# Patient Record
Sex: Male | Born: 1990
Health system: Southern US, Community
[De-identification: ages and names within clinical notes are randomized; demographics above are authoritative.]

## PROBLEM LIST (undated history)

## (undated) DIAGNOSIS — K589 Irritable bowel syndrome without diarrhea: Secondary | ICD-10-CM

## (undated) DIAGNOSIS — E782 Mixed hyperlipidemia: Secondary | ICD-10-CM

## (undated) DIAGNOSIS — R05 Cough: Principal | ICD-10-CM

## (undated) DIAGNOSIS — Z0000 Encounter for general adult medical examination without abnormal findings: Secondary | ICD-10-CM

## (undated) DIAGNOSIS — I10 Essential (primary) hypertension: Secondary | ICD-10-CM

## (undated) DIAGNOSIS — R030 Elevated blood-pressure reading, without diagnosis of hypertension: Secondary | ICD-10-CM

## (undated) DIAGNOSIS — K449 Diaphragmatic hernia without obstruction or gangrene: Secondary | ICD-10-CM

## (undated) DIAGNOSIS — F909 Attention-deficit hyperactivity disorder, unspecified type: Secondary | ICD-10-CM

## (undated) DIAGNOSIS — K219 Gastro-esophageal reflux disease without esophagitis: Secondary | ICD-10-CM

## (undated) DIAGNOSIS — M545 Low back pain: Secondary | ICD-10-CM

## (undated) DIAGNOSIS — R1011 Right upper quadrant pain: Secondary | ICD-10-CM

## (undated) DIAGNOSIS — Q639 Congenital malformation of kidney, unspecified: Secondary | ICD-10-CM

## (undated) DIAGNOSIS — A084 Viral intestinal infection, unspecified: Secondary | ICD-10-CM

## (undated) DIAGNOSIS — F329 Major depressive disorder, single episode, unspecified: Secondary | ICD-10-CM

## (undated) DIAGNOSIS — R739 Hyperglycemia, unspecified: Secondary | ICD-10-CM

## (undated) DIAGNOSIS — L906 Striae atrophicae: Secondary | ICD-10-CM

## (undated) DIAGNOSIS — W57XXXA Bitten or stung by nonvenomous insect and other nonvenomous arthropods, initial encounter: Secondary | ICD-10-CM

## (undated) DIAGNOSIS — K760 Fatty (change of) liver, not elsewhere classified: Secondary | ICD-10-CM

## (undated) DIAGNOSIS — G4700 Insomnia, unspecified: Secondary | ICD-10-CM

## (undated) DIAGNOSIS — B354 Tinea corporis: Secondary | ICD-10-CM

## (undated) DIAGNOSIS — E663 Overweight: Secondary | ICD-10-CM

## (undated) DIAGNOSIS — R0602 Shortness of breath: Secondary | ICD-10-CM

## (undated) DIAGNOSIS — H6690 Otitis media, unspecified, unspecified ear: Secondary | ICD-10-CM

## (undated) DIAGNOSIS — L989 Disorder of the skin and subcutaneous tissue, unspecified: Secondary | ICD-10-CM

## (undated) DIAGNOSIS — R1013 Epigastric pain: Secondary | ICD-10-CM

## (undated) DIAGNOSIS — M25562 Pain in left knee: Principal | ICD-10-CM

## (undated) DIAGNOSIS — H6692 Otitis media, unspecified, left ear: Secondary | ICD-10-CM

## (undated) DIAGNOSIS — T7840XA Allergy, unspecified, initial encounter: Secondary | ICD-10-CM

## (undated) DIAGNOSIS — F32A Depression, unspecified: Secondary | ICD-10-CM

## (undated) HISTORY — DX: Attention-deficit hyperactivity disorder, unspecified type: F90.9

## (undated) HISTORY — PX: THYROGLOSSAL DUCT CYST: SHX297

## (undated) HISTORY — DX: Tinea corporis: B35.4

## (undated) HISTORY — DX: Major depressive disorder, single episode, unspecified: F32.9

## (undated) HISTORY — DX: Irritable bowel syndrome without diarrhea: K58.9

## (undated) HISTORY — DX: Hyperglycemia, unspecified: R73.9

## (undated) HISTORY — DX: Encounter for general adult medical examination without abnormal findings: Z00.00

## (undated) HISTORY — DX: Cough: R05

## (undated) HISTORY — DX: Diaphragmatic hernia without obstruction or gangrene: K44.9

## (undated) HISTORY — DX: Otitis media, unspecified, left ear: H66.92

## (undated) HISTORY — DX: Disorder of the skin and subcutaneous tissue, unspecified: L98.9

## (undated) HISTORY — DX: Epigastric pain: R10.13

## (undated) HISTORY — DX: Insomnia, unspecified: G47.00

## (undated) HISTORY — DX: Congenital malformation of kidney, unspecified: Q63.9

## (undated) HISTORY — DX: Fatty (change of) liver, not elsewhere classified: K76.0

## (undated) HISTORY — DX: Low back pain: M54.5

## (undated) HISTORY — DX: Elevated blood-pressure reading, without diagnosis of hypertension: R03.0

## (undated) HISTORY — DX: Essential (primary) hypertension: I10

## (undated) HISTORY — DX: Allergy, unspecified, initial encounter: T78.40XA

## (undated) HISTORY — DX: Right upper quadrant pain: R10.11

## (undated) HISTORY — PX: TYMPANOSTOMY TUBE PLACEMENT: SHX32

## (undated) HISTORY — DX: Shortness of breath: R06.02

## (undated) HISTORY — DX: Gastro-esophageal reflux disease without esophagitis: K21.9

## (undated) HISTORY — DX: Depression, unspecified: F32.A

## (undated) HISTORY — DX: Pain in left knee: M25.562

## (undated) HISTORY — DX: Striae atrophicae: L90.6

## (undated) HISTORY — DX: Mixed hyperlipidemia: E78.2

## (undated) HISTORY — DX: Viral intestinal infection, unspecified: A08.4

## (undated) HISTORY — DX: Overweight: E66.3

## (undated) HISTORY — DX: Bitten or stung by nonvenomous insect and other nonvenomous arthropods, initial encounter: W57.XXXA

## (undated) HISTORY — PX: CIRCUMCISION: SUR203

## (undated) HISTORY — DX: Otitis media, unspecified, unspecified ear: H66.90

## (undated) HISTORY — PX: TONSILLECTOMY AND ADENOIDECTOMY: SUR1326

---
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
## 2012-03-04 ENCOUNTER — Encounter: Payer: Self-pay | Admitting: Family Medicine

## 2012-03-04 ENCOUNTER — Ambulatory Visit (INDEPENDENT_AMBULATORY_CARE_PROVIDER_SITE_OTHER): Payer: Medicare HMO | Admitting: Family Medicine

## 2012-03-04 VITALS — BP 128/87 | HR 92 | Temp 98.4°F | Ht 73.25 in | Wt 277.0 lb

## 2012-03-04 DIAGNOSIS — R102 Pelvic and perineal pain: Secondary | ICD-10-CM

## 2012-03-04 DIAGNOSIS — N509 Disorder of male genital organs, unspecified: Secondary | ICD-10-CM

## 2012-03-04 NOTE — Progress Notes (Signed)
OFFICE NOTE  03/04/2012  CC:  Chief Complaint  Patient presents with  . Groin Pain    ? hernia; bulge and swelling in groin; pain at night     HPI: Patient is a 22 y.o. Caucasian male who is here for question of hernia. Noted onset of bulging 7 days ago diffusely across suprapubic area and this is associated with a feeling that his penis is getting retracted/pulled in.  Uncomfortable feeling.  Testicles don't feel or look any different.  Mild nagging pain in area of bulging on and off.  No meds taken.  Eating fine, passing gas and having BMs normal.  Urinating normal. He does lift heavy things at work frequently. The more he is questioned, the more it seems that the bulging is not that new but maybe more pronounced lately, plus his dad seems to have noted it after pt expressed discomfort with the "pulling" sensation in penis region, and encouraged him to come in today.   Pertinent PMH:  Past Medical History  Diagnosis Date  . Depression   . Overweight 07/07/2010  . Acute low back pain 07/07/2010  . 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 ADHD (attention deficit hyperactivity disorder) 07/07/2010  . 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 GERD 01/14/2009  . ELEVATED BLOOD PRESSURE 01/14/2009  . Insomnia 08/04/2010  . Allergic state 09/04/2010  . Viral gastroenteritis 10/17/2010  . Otitis media of left ear 12/09/2010  . SOB (shortness of breath) on exertion 03/06/2011  . Skin lesion of left leg 04/01/2011  . Cough 04/06/2011  . Knee pain, left 09/24/2011  . Otitis media 11/24/2011    MEDS:  Outpatient Prescriptions Prior to Visit  Medication Sig Dispense Refill  . cloNIDine (CATAPRES) 0.1 MG tablet Take 1 tablet (0.1 mg total) by mouth daily.  30 tablet  2  . lisdexamfetamine (VYVANSE) 50 MG capsule Take 1 capsule (50 mg total) by mouth every morning. Nov 2013 RX  30 capsule  0  . metoprolol succinate (TOPROL-XL) 25 MG 24 hr tablet Take 1 tablet (25 mg total) by mouth daily.  30 tablet   3  . naproxen sodium (ALEVE) 220 MG tablet Take 1 tablet (220 mg total) by mouth 2 (two) times daily with a meal. For a week and then 1 tab daily with food for the next week and call if no improvement  20 tablet  0  . pantoprazole (PROTONIX) 40 MG tablet Take 1 tablet (40 mg total) by mouth daily.  30 tablet  1  . ranitidine (ZANTAC) 300 MG tablet Take 1 tablet (300 mg total) by mouth 2 (two) times daily.  30 tablet  6  . [DISCONTINUED] amoxicillin-clavulanate (AUGMENTIN) 875-125 MG per tablet Take 1 tablet by mouth 2 (two) times daily.  20 tablet  0  . [DISCONTINUED] guaiFENesin (MUCINEX) 600 MG 12 hr tablet Take 1 tablet (600 mg total) by mouth 2 (two) times daily.  20 tablet  0  . [DISCONTINUED] triamcinolone cream (KENALOG) 0.5 % Apply two times daily to affected areas  15 g  1   Last reviewed on 03/04/2012  3:44 PM by Jeoffrey Massed, MD  PE: Blood pressure 128/87, pulse 92, temperature 98.4 F (36.9 C), temperature source Temporal, height 6' 1.25" (1.861 m), weight 277 lb (125.646 kg), SpO2 97.00%. Gen: Alert, well appearing, obese white male in NAD.  Patient is oriented to person, place, time, and situation. GU: symmetric suprapubic fat prominence, without focal palpable mass or abd wall defect.  Nontender.  He is uncircumcised but foreskin is easily retractable and normal in appearance.  Scrotum and testicles without mass, swelling, erythema, or tenderness.   IMPRESSION AND PLAN:  GU complaint: no sign of abnormality.  Reassured pt that I don't see any sign of hernia. Discussed the possibility of intermittent periods of foreskin adhesions to glans which may lead to the "pulling" sensation he is bothered by.  Aquafor to peri-coronal/glans region recommended prn. Call of worsening sx's or new sx's.  FOLLOW UP: prn

## 2012-03-04 NOTE — Patient Instructions (Addendum)
Buy over the counter ointment called Aquafor, apply to affected area every evening.

## 2012-04-11 ENCOUNTER — Telehealth: Payer: Self-pay | Admitting: Family Medicine

## 2012-04-11 DIAGNOSIS — F988 Other specified behavioral and emotional disorders with onset usually occurring in childhood and adolescence: Secondary | ICD-10-CM

## 2012-04-11 NOTE — Telephone Encounter (Signed)
Will hold this until Wed when we are at OR

## 2012-04-11 NOTE — Telephone Encounter (Signed)
Please advise Vyvanse refill? Last RX was wrote 10-21-11 quantity 30 with 0 refills for [facility] [employer].  Pt wants to pick up RX in OR so we could print Wed if ok to refill

## 2012-04-11 NOTE — Telephone Encounter (Signed)
OK to refill vyvanse same strength, same sig, #30

## 2012-04-11 NOTE — Telephone Encounter (Signed)
Patient is requesting a new prescription of vyvance and will pick up at the [facility] office.

## 2012-04-13 MED ORDER — LISDEXAMFETAMINE DIMESYLATE 50 MG PO CAPS: 50.0000 mg | ORAL_CAPSULE | ORAL | Status: DC

## 2012-04-13 NOTE — Telephone Encounter (Signed)
RX printed and put at front desk at OR. Pt informed

## 2012-07-19 ENCOUNTER — Telehealth: Payer: Self-pay

## 2012-07-19 DIAGNOSIS — F988 Other specified behavioral and emotional disorders with onset usually occurring in childhood and adolescence: Secondary | ICD-10-CM

## 2012-07-19 NOTE — Telephone Encounter (Signed)
OK to refill Vyvanse same strength, same sig, #30

## 2012-07-19 NOTE — Telephone Encounter (Signed)
Pts mom left a message stating pt needs a refill on his Vyvannse? Last RX was wrote on 04-13-12 quantity 30 with 0 refills  Please advise?

## 2012-07-20 MED ORDER — LISDEXAMFETAMINE DIMESYLATE 50 MG PO CAPS: 50.0000 mg | ORAL_CAPSULE | ORAL | Status: DC

## 2012-07-20 NOTE — Telephone Encounter (Signed)
RX printed and put in front cabinet. Mother informed

## 2012-10-14 ENCOUNTER — Encounter: Payer: Self-pay | Admitting: Family Medicine

## 2012-10-14 ENCOUNTER — Ambulatory Visit (INDEPENDENT_AMBULATORY_CARE_PROVIDER_SITE_OTHER): Payer: 59 | Admitting: Family Medicine

## 2012-10-14 ENCOUNTER — Ambulatory Visit: Payer: 59 | Admitting: Family Medicine

## 2012-10-14 VITALS — BP 130/80 | HR 89 | Temp 98.2°F | Ht 73.25 in | Wt 293.2 lb

## 2012-10-14 DIAGNOSIS — L0291 Cutaneous abscess, unspecified: Secondary | ICD-10-CM

## 2012-10-14 DIAGNOSIS — L0390 Cellulitis, unspecified: Secondary | ICD-10-CM

## 2012-10-14 DIAGNOSIS — W57XXXA Bitten or stung by nonvenomous insect and other nonvenomous arthropods, initial encounter: Secondary | ICD-10-CM

## 2012-10-14 DIAGNOSIS — F909 Attention-deficit hyperactivity disorder, unspecified type: Secondary | ICD-10-CM

## 2012-10-14 DIAGNOSIS — K219 Gastro-esophageal reflux disease without esophagitis: Secondary | ICD-10-CM

## 2012-10-14 DIAGNOSIS — L989 Disorder of the skin and subcutaneous tissue, unspecified: Secondary | ICD-10-CM

## 2012-10-14 DIAGNOSIS — R030 Elevated blood-pressure reading, without diagnosis of hypertension: Secondary | ICD-10-CM

## 2012-10-14 DIAGNOSIS — T148 Other injury of unspecified body region: Secondary | ICD-10-CM

## 2012-10-14 MED ORDER — DOXYCYCLINE HYCLATE 100 MG PO TABS: 100.0000 mg | ORAL_TABLET | Freq: Two times a day (BID) | ORAL | Status: DC

## 2012-10-14 MED ORDER — RANITIDINE HCL 300 MG PO TABS: 300.0000 mg | ORAL_TABLET | Freq: Every evening | ORAL | Status: DC | PRN

## 2012-10-14 NOTE — Patient Instructions (Addendum)
Start a probiotic daily such as Digestive Advantage, Align or a generic  Wood Tick Bite Ticks are insects that attach themselves to the skin. Most tick bites are harmless, but sometimes ticks carry diseases that can make a person quite ill. The chance of getting ill depends on:  The kind of tick that bites you.  Time of year.  How long the tick is attached.  Geographic location. Wood ticks are also called dog ticks. They are generally black. They can have white markings. They live in shrubs and grassy areas. They are larger than deer ticks. Wood ticks are about the size of a watermelon seed. They have a hard body. The most common places for ticks to attach themselves are the scalp, neck, armpits, waist, and groin. Wood ticks may stay attached for up to 2 weeks. TICKS MUST BE REMOVED AS SOON AS POSSIBLE TO HELP PREVENT DISEASES CAUSED BY TICK BITES.  TO REMOVE A TICK: 1. If available, put on latex gloves before trying to remove a tick. 2. Grasp the tick as close to the skin as possible, with curved forceps, fine tweezers or a special tick removal tool. 3. Pull gently with steady pressure until the tick lets go. Do not twist the tick or jerk it suddenly. This may break off the tick's head or mouth parts. 4. Do not crush the tick's body. This could force disease-carrying fluids from the tick into your body. 5. After the tick is removed, wash the bite area and your hands with soap and water or other disinfectant. 6. Apply a small amount of antiseptic cream or ointment to the bite site. 7. Wash and disinfect any instruments that were used. 8. Save the tick in a jar or plastic bag for later identification. Preserve the tick with a bit of alcohol or put it in the freezer. 9. Do not apply a hot match, petroleum jelly, or fingernail polish to the tick. This does not work and may increase the chances of disease from the tick bite. YOU MAY NEED TO SEE YOUR CAREGIVER FOR A TETANUS SHOT NOW IF:  You  have no idea when you had the last one.  You have never had a tetanus shot before. If you need a tetanus shot, and you decide not to get one, there is a rare chance of getting tetanus. Sickness from tetanus can be serious. If you get a tetanus shot, your arm may swell, get red and warm to the touch at the shot site. This is common and not a problem. PREVENTION  Wear protective clothing. Long sleeves and pants are best.  Wear white clothes to see ticks more easily  Tuck your pant legs into your socks.  If walking on trail, stay in the middle of the trail to avoid brushing against bushes.  Put insect repellent on all exposed skin and along boot tops, pant legs and sleeve cuffs  Check clothing, hair and skin repeatedly and before coming inside.  Brush off any ticks that are not attached. SEEK MEDICAL CARE IF:   You cannot remove a tick or part of the tick that is left in the skin.  Unexplained fever.  Redness and swelling in the area of the tick bite.  Tender, swollen lymph glands.  Diarrhea.  Weight loss.  Cough.  Fatigue.  Muscle, joint or bone pain.  Belly pain.  Headache.  Rash. SEEK IMMEDIATE MEDICAL CARE IF:   You develop an oral temperature above 102 F (38.9 C).  You are  having trouble walking or moving your legs.  Numbness in the legs.  Shortness of breath.  Confusion.  Repeated vomiting. Document Released: 01/31/2000 Document Revised: 04/27/2011 Document Reviewed: 01/09/2008 [facility] Patient Information 2014 [place], [place]. Deer Tick Bite Deer ticks are brown arachnids (spider family) that vary in size from as small as the head of a pin to 1/4 inch (1/2 cm) diameter. They thrive in wooded areas. Deer are the preferred host of adult deer ticks. Small rodents are the host of young ticks (nymphs). When a person walks in a field or wooded area, young and adult ticks in the surrounding grass and vegetation can attach themselves to the skin. They can  suck blood for hours to days if unnoticed. Ticks are found all over the [place] Some ticks carry a specific bacteria (Borrelia burgdorferi) that causes an infection called Lyme disease. The bacteria is typically passed into a person during the blood sucking process. This happens after the tick has been attached for at least a number of hours. While ticks can be found all over the [place] those carrying the bacteria that causes Lyme disease are most common in [place] and the [place]. Only a small proportion of ticks in these areas carry the Lyme disease bacteria and cause human infections. Ticks usually attach to warm spots on the body, such as the:  Head.  Back.  Neck.  Armpits.  Groin. SYMPTOMS  Most of the time, a deer tick bite will not be felt. You may or may not see the attached tick. You may notice mild irritation or redness around the bite site. If the deer tick passes the Lyme disease bacteria to a person, a round, red rash may be noticed 2 to 3 days after the bite. The rash may be clear in the middle, like a bull's-eye or target. If not treated, other symptoms may develop several days to weeks after the onset of the rash. These symptoms may include:  New rash lesions.  Fatigue and weakness.  General ill feeling and achiness.  Chills.  Headache and neck pain.  Swollen lymph glands.  Sore muscles and joints. 5 to 15% of untreated people with Lyme disease may develop more severe illnesses after several weeks to months. This may include inflammation of the brain lining (meningitis), nerve palsies, an abnormal heartbeat, or severe muscle and joint pain and inflammation (myositis or arthritis). DIAGNOSIS   Physical exam and medical history.  Viewing the tick if it was saved for confirmation.  Blood tests (to check or confirm the presence of Lyme disease). TREATMENT  Most ticks do not carry disease. If found, an attached tick should be removed using tweezers. Tweezers should  be placed under the body of the tick so it is removed by its attachment parts (pincers). If there are signs or symptoms of being sick, or Lyme disease is confirmed, medicines (antibiotics) that kill germs are usually prescribed. In more severe cases, antibiotics may be given through an intravenous (IV) access. HOME CARE INSTRUCTIONS   Always remove ticks with tweezers. Do not use petroleum jelly or other methods to kill or remove the tick. Slide the tweezers under the body and pull out as much as you can. If you are not sure what it is, save it in a jar and show your caregiver.  Once you remove the tick, the skin will heal on its own. Wash your hands and the affected area with water and soap. You may place a bandage on the affected area.  Take medicine as directed. You may be advised to take a full course of antibiotics.  Follow up with your caregiver as recommended. FINDING OUT THE RESULTS OF YOUR TEST Not all test results are available during your visit. If your test results are not back during the visit, make an appointment with your caregiver to find out the results. Do not assume everything is normal if you have not heard from your caregiver or the medical facility. It is important for you to follow up on all of your test results. PROGNOSIS  If Lyme disease is confirmed, early treatment with antibiotics is very effective. Following preventive guidelines is important since it is possible to get the disease more than once. PREVENTION   Wear long sleeves and long pants in wooded or grassy areas. Tuck your pants into your socks.  Use an insect repellent while hiking.  Check yourself, your children, and your pets regularly for ticks after playing outside.  Clear piles of leaves or brush from your yard. Ticks might live there. SEEK MEDICAL CARE IF:   You or your child has an oral temperature above 102 F (38.9 C).  You develop a severe headache following the bite.  You feel generally  ill.  You notice a rash.  You are having trouble removing the tick.  The bite area has red skin or yellow drainage. SEEK IMMEDIATE MEDICAL CARE IF:   Your face is weak and droopy or you have other neurological symptoms.  You have severe joint pain or weakness. MAKE SURE YOU:   Understand these instructions.  Will watch your condition.  Will get help right away if you are not doing well or get worse. FOR MORE INFORMATION Centers for Disease Control and Prevention: FootballExhibition.com.br American Academy [employer]: www.https://powers.com/ Document Released: 04/29/2009 Document Revised: 04/27/2011 Document Reviewed: 04/29/2009 [facility] Patient Information 2014 [place], [place].

## 2012-10-17 ENCOUNTER — Encounter: Payer: Self-pay | Admitting: Family Medicine

## 2012-10-17 DIAGNOSIS — W57XXXA Bitten or stung by nonvenomous insect and other nonvenomous arthropods, initial encounter: Secondary | ICD-10-CM

## 2012-10-17 HISTORY — DX: Bitten or stung by nonvenomous insect and other nonvenomous arthropods, initial encounter: W57.XXXA

## 2012-10-17 NOTE — Assessment & Plan Note (Signed)
Not taking anything but having frequent symptoms, avoid offending foods, start a probiotic. Restart ranitidine

## 2012-10-17 NOTE — Assessment & Plan Note (Signed)
Not taking medications at this time

## 2012-10-17 NOTE — Progress Notes (Signed)
Patient ID: Scott Pope, male   DOB: [birth year], 22 y.o.   MRN: [id number] Scott Pope [id number] January 21, 1991 10/17/2012      Progress Note-Follow Up  Subjective  Chief Complaint  Chief Complaint  Patient presents with  . Insect Bite    HPI Patient is a 22 year old male in today for evaluation of tick bite. He observed the tick bite about 2 weeks ago. He was there for several days. He's had persistent discomfort in the area as well some itching and drainage. Did initially have a low-grade fever and some fatigue but that is improving. No headaches or myalgias. No chest pain, palpitations, shortness of breath. Is having persistent heartburn but is not taking any medications.  Past Medical History  Diagnosis Date  . Depression   . Overweight(278.02) 07/07/2010  . Acute low back pain 07/07/2010  . 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 ADHD (attention deficit hyperactivity disorder) 07/07/2010  . 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 GERD 01/14/2009  . ELEVATED BLOOD PRESSURE 01/14/2009  . Insomnia 08/04/2010  . Allergic state 09/04/2010  . Viral gastroenteritis 10/17/2010  . Otitis media of left ear 12/09/2010  . SOB (shortness of breath) on exertion 03/06/2011  . Skin lesion of left leg 04/01/2011  . Cough 04/06/2011  . Knee pain, left 09/24/2011  . Otitis media 11/24/2011  . Tick bite 10/17/2012    Past Surgical History  Procedure Laterality Date  . Thyroglossal duct cyst      excised at 22 years of age  . Tympanostomy tube placement      multiple   . Circumcision  AT 22YRS OLD    HAD WILD REACTION TO SEDATION MED GIVEN    Family History  Problem Relation Age of Onset  . Obesity Mother   . Depression Mother   . Fibromyalgia Mother   . Hypertension Mother   . Chronic fatigue Mother   . Hypertension Father   . Fibromyalgia Maternal Grandmother   . Other Maternal Grandmother     reflux  . Other Maternal Grandfather     Lems disease  . Hypertension Paternal Grandfather   .  Arthritis Paternal Grandfather   . Colon cancer Neg Hx     History   Social History  . Marital Status: Single    Spouse Name: N/A    Number of Children: N/A  . Years of Education: N/A   Occupational History  . Not on file.   Social History Main Topics  . Smoking status: Never Smoker   . Smokeless tobacco: Never Used  . Alcohol Use: No  . Drug Use: No  . Sexual Activity: No   Other Topics Concern  . Not on file   Social History Narrative  . No narrative on file    No current outpatient prescriptions on file prior to visit.   No current facility-administered medications on file prior to visit.    Allergies  Allergen Reactions  . Strattera [Atomoxetine Hcl]     Suicidal thoughts, depression    Review of Systems  Review of Systems  Constitutional: Positive for malaise/fatigue. Negative for fever.  HENT: Negative for congestion.   Eyes: Negative for discharge.  Respiratory: Negative for shortness of breath.   Cardiovascular: Negative for chest pain, palpitations and leg swelling.  Gastrointestinal: Negative for nausea, abdominal pain and diarrhea.  Genitourinary: Negative for dysuria.  Musculoskeletal: Negative for falls.  Skin: Positive for itching and rash.  Neurological: Negative for loss of consciousness and headaches.  Endo/Heme/Allergies: Negative for polydipsia.  Psychiatric/Behavioral: Negative for depression and suicidal ideas. The patient is not nervous/anxious and does not have insomnia.     Objective  BP 130/80  Pulse 89  Temp(Src) 98.2 F (36.8 C) (Oral)  Ht 6' 1.25" (1.861 m)  Wt 293 lb 4 oz (133.017 kg)  BMI 38.41 kg/m2  SpO2 97%  Physical Exam  Physical Exam  Constitutional: He is oriented to person, place, and time and well-developed, well-nourished, and in no distress. No distress.  HENT:  Head: Normocephalic and atraumatic.  Eyes: Conjunctivae are normal.  Neck: Neck supple. No thyromegaly present.  Cardiovascular: Normal rate,  regular rhythm and normal heart sounds.   No murmur heard. Pulmonary/Chest: Effort normal and breath sounds normal. No respiratory distress.  Abdominal: He exhibits no distension and no mass. There is no tenderness.  Musculoskeletal: He exhibits no edema.  Neurological: He is alert and oriented to person, place, and time.  Skin: Skin is warm.  Erythematous maculopapular lesion behind knee, pus expelled.   Psychiatric: Memory, affect and judgment normal.      Assessment & Plan  GERD Not taking anything but having frequent symptoms, avoid offending foods, start a probiotic. Restart ranitidine  ELEVATED BLOOD PRESSURE Not on ADD meds no need for medications at this time.  ADHD (attention deficit hyperactivity disorder) Not taking medications at this time  Tick bite With slight cellulitis, started on Doxycycline, clean daily with hydrogen peroxide

## 2012-10-17 NOTE — Assessment & Plan Note (Signed)
Not on ADD meds no need for medications at this time.

## 2012-10-17 NOTE — Assessment & Plan Note (Signed)
With slight cellulitis, started on Doxycycline, clean daily with hydrogen peroxide

## 2012-11-22 ENCOUNTER — Ambulatory Visit (INDEPENDENT_AMBULATORY_CARE_PROVIDER_SITE_OTHER): Payer: 59 | Admitting: Family Medicine

## 2012-11-22 ENCOUNTER — Encounter: Payer: Self-pay | Admitting: Family Medicine

## 2012-11-22 VITALS — BP 118/82 | HR 62 | Temp 98.0°F | Ht 73.25 in | Wt 293.1 lb

## 2012-11-22 DIAGNOSIS — R030 Elevated blood-pressure reading, without diagnosis of hypertension: Secondary | ICD-10-CM

## 2012-11-22 DIAGNOSIS — B354 Tinea corporis: Secondary | ICD-10-CM

## 2012-11-22 DIAGNOSIS — Z0000 Encounter for general adult medical examination without abnormal findings: Secondary | ICD-10-CM

## 2012-11-22 DIAGNOSIS — Z23 Encounter for immunization: Secondary | ICD-10-CM

## 2012-11-22 HISTORY — DX: Encounter for general adult medical examination without abnormal findings: Z00.00

## 2012-11-22 HISTORY — DX: Tinea corporis: B35.4

## 2012-11-22 MED ORDER — CLOTRIMAZOLE-BETAMETHASONE 1-0.05 % EX CREA: TOPICAL_CREAM | Freq: Two times a day (BID) | CUTANEOUS | Status: DC | PRN

## 2012-11-22 NOTE — Assessment & Plan Note (Signed)
Well controlled, no changes 

## 2012-11-22 NOTE — Assessment & Plan Note (Signed)
Given Tdap and flu shot

## 2012-11-22 NOTE — Assessment & Plan Note (Signed)
Apply Lotrisone bid

## 2012-11-22 NOTE — Progress Notes (Signed)
Patient ID: Scott Pope, male   DOB: April 28, 1990, 22 y.o.   MRN: [id number] IMAAD REUSS [id number] March 03, 1990 11/22/2012      Progress Note-Follow Up  Subjective  Chief Complaint  Chief Complaint  Patient presents with  . knot on hand    X 1 month- right hand- itches and hurts if "poking on it", has got bigger  . Injections    flu and tdap    HPI  Patient is a 22 yo W male in today for evaluation of a lesion on his right wrist for about a month that is enlarging and pruritic. No pain, redness or injury. No radicular symptoms, no similar lesion. No recent illness, fevers, malaise, myalgias, cp, palp  Past Medical History  Diagnosis Date  . Depression   . Overweight(278.02) 07/07/2010  . Acute low back pain 07/07/2010  . 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 ADHD (attention deficit hyperactivity disorder) 07/07/2010  . 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 GERD 01/14/2009  . ELEVATED BLOOD PRESSURE 01/14/2009  . Insomnia 08/04/2010  . Allergic state 09/04/2010  . Viral gastroenteritis 10/17/2010  . Otitis media of left ear 12/09/2010  . SOB (shortness of breath) on exertion 03/06/2011  . Skin lesion of left leg 04/01/2011  . Cough 04/06/2011  . Knee pain, left 09/24/2011  . Otitis media 11/24/2011  . Tick bite 10/17/2012  . Tinea corporis 11/22/2012  . Preventative health care 11/22/2012    Past Surgical History  Procedure Laterality Date  . Thyroglossal duct cyst      excised at 22 years of age  . Tympanostomy tube placement      multiple   . Circumcision  AT 22YRS OLD    HAD WILD REACTION TO SEDATION MED GIVEN    Family History  Problem Relation Age of Onset  . Obesity Mother   . Depression Mother   . Fibromyalgia Mother   . Hypertension Mother   . Chronic fatigue Mother   . Hypertension Father   . Fibromyalgia Maternal Grandmother   . Other Maternal Grandmother     reflux  . Other Maternal Grandfather     Lems disease  . Hypertension Paternal Grandfather   .  Arthritis Paternal Grandfather   . Colon cancer Neg Hx     History   Social History  . Marital Status: Single    Spouse Name: N/A    Number of Children: N/A  . Years of Education: N/A   Occupational History  . Not on file.   Social History Main Topics  . Smoking status: Never Smoker   . Smokeless tobacco: Never Used  . Alcohol Use: No  . Drug Use: No  . Sexual Activity: No   Other Topics Concern  . Not on file   Social History Narrative  . No narrative on file    Current Outpatient Prescriptions on File Prior to Visit  Medication Sig Dispense Refill  . ranitidine (ZANTAC) 300 MG tablet Take 1 tablet (300 mg total) by mouth at bedtime as needed for heartburn.  30 tablet  2   No current facility-administered medications on file prior to visit.    Allergies  Allergen Reactions  . Strattera [Atomoxetine Hcl]     Suicidal thoughts, depression    Review of Systems  Review of Systems  Constitutional: Negative for fever and malaise/fatigue.  HENT: Negative for congestion.   Eyes: Negative for discharge.  Respiratory: Negative for shortness of breath.   Cardiovascular: Negative for chest  pain, palpitations and leg swelling.  Gastrointestinal: Negative for nausea, abdominal pain and diarrhea.  Genitourinary: Negative for dysuria.  Musculoskeletal: Negative for falls.  Skin: Positive for itching and rash.       Right hand  Neurological: Negative for loss of consciousness and headaches.  Endo/Heme/Allergies: Negative for polydipsia.  Psychiatric/Behavioral: Negative for depression and suicidal ideas. The patient is not nervous/anxious and does not have insomnia.     Objective  BP 118/82  Pulse 62  Temp(Src) 98 F (36.7 C) (Oral)  Ht 6' 1.25" (1.861 m)  Wt 293 lb 1.9 oz (132.958 kg)  BMI 38.39 kg/m2  SpO2 98%  Physical Exam  Physical Exam  Constitutional: He is oriented to person, place, and time and well-developed, well-nourished, and in no distress. No  distress.  HENT:  Head: Normocephalic and atraumatic.  Eyes: Conjunctivae are normal.  Neck: Neck supple. No thyromegaly present.  Cardiovascular: Normal rate, regular rhythm and normal heart sounds.   No murmur heard. Pulmonary/Chest: Effort normal and breath sounds normal. No respiratory distress.  Abdominal: He exhibits no distension and no mass. There is no tenderness.  Musculoskeletal: He exhibits no edema.  Neurological: He is alert and oriented to person, place, and time.  Skin: Skin is warm.  1 cm raised, erythematous lesion over right wrist  Psychiatric: Memory, affect and judgment normal.      Assessment & Plan  Tinea corporis Apply Lotrisone bid  ELEVATED BLOOD PRESSURE Well controlled, no changes  Preventative health care Given Tdap and flu shot

## 2012-11-22 NOTE — Patient Instructions (Addendum)
  Lotrimin/Clotrimazole is another option  Body Ringworm Ringworm (tinea corporis) is a fungal infection of the skin on the body. This infection is not caused by worms, but is actually caused by a fungus. Fungus normally lives on the top of your skin and can be useful. However, in the case of ringworms, the fungus grows out of control and causes a skin infection. It can involve any area of skin on the body and can spread easily from one person to another (contagious). Ringworm is a common problem for children, but it can affect adults as well. Ringworm is also often found in athletes, especially wrestlers who share equipment and mats.  CAUSES  Ringworm of the body is caused by a fungus called dermatophyte. It can spread by:  Touchingother people who are infected.  Touchinginfected pets.  Touching or sharingobjects that have been in contact with the infected person or pet (hats, combs, towels, clothing, sports equipment). SYMPTOMS   Itchy, raised red spots and bumps on the skin.  Ring-shaped rash.  Redness near the border of the rash with a clear center.  Dry and scaly skin on or around the rash. Not every person develops a ring-shaped rash. Some develop only the red, scaly patches. DIAGNOSIS  Most often, ringworm can be diagnosed by performing a skin exam. Your caregiver may choose to take a skin scraping from the affected area. The sample will be examined under the microscope to see if the fungus is present.  TREATMENT  Body ringworm may be treated with a topical antifungal cream or ointment. Sometimes, an antifungal shampoo that can be used on your body is prescribed. You may be prescribed antifungal medicines to take by mouth if your ringworm is severe, keeps coming back, or lasts a long time.  HOME CARE INSTRUCTIONS   Only take over-the-counter or prescription medicines as directed by your caregiver.  Wash the infected area and dry it completely before applying yourcream or  ointment.  When using antifungal shampoo to treat the ringworm, leave the shampoo on the body for 3 5 minutes before rinsing.   Wear loose clothing to stop clothes from rubbing and irritating the rash.  Wash or change your bed sheets every night while you have the rash.  Have your pet treated by your veterinarian if it has the same infection. To prevent ringworm:   Practice good hygiene.  Wear sandals or shoes in public places and showers.  Do not share personal items with others.  Avoid touching red patches of skin on other people.  Avoid touching pets that have bald spots or wash your hands after doing so. SEEK MEDICAL CARE IF:   Your rash continues to spread after 7 days of treatment.  Your rash is not gone in 4 weeks.  The area around your rash becomes red, warm, tender, and swollen. Document Released: 01/31/2000 Document Revised: 10/28/2011 Document Reviewed: 08/17/2011 [facility] Patient Information 2014 [place], [place].

## 2012-12-13 ENCOUNTER — Other Ambulatory Visit: Payer: Self-pay | Admitting: Family Medicine

## 2012-12-30 ENCOUNTER — Ambulatory Visit: Payer: 59 | Admitting: Family Medicine

## 2012-12-30 DIAGNOSIS — Z0289 Encounter for other administrative examinations: Secondary | ICD-10-CM

## 2013-01-23 ENCOUNTER — Encounter: Payer: Self-pay | Admitting: Physician Assistant

## 2013-01-23 ENCOUNTER — Ambulatory Visit: Payer: 59 | Admitting: Physician Assistant

## 2013-01-23 ENCOUNTER — Ambulatory Visit (INDEPENDENT_AMBULATORY_CARE_PROVIDER_SITE_OTHER): Payer: 59 | Admitting: Physician Assistant

## 2013-01-23 VITALS — BP 138/86 | HR 85 | Temp 98.7°F | Ht 73.25 in | Wt 296.0 lb

## 2013-01-23 DIAGNOSIS — R030 Elevated blood-pressure reading, without diagnosis of hypertension: Secondary | ICD-10-CM

## 2013-01-23 DIAGNOSIS — F909 Attention-deficit hyperactivity disorder, unspecified type: Secondary | ICD-10-CM

## 2013-01-23 DIAGNOSIS — F988 Other specified behavioral and emotional disorders with onset usually occurring in childhood and adolescence: Secondary | ICD-10-CM

## 2013-01-23 MED ORDER — LISDEXAMFETAMINE DIMESYLATE 50 MG PO CAPS: 50.0000 mg | ORAL_CAPSULE | ORAL | Status: DC

## 2013-01-23 MED ORDER — METOPROLOL SUCCINATE ER 25 MG PO TB24: 25.0000 mg | ORAL_TABLET | Freq: Every day | ORAL | Status: DC

## 2013-01-23 NOTE — Assessment & Plan Note (Signed)
Vyvanse restarted. Further refills to come from primary care provider.  Also restarted metoprolol given hx of elevated BPS while taking Vyvanse.  Followup in 2 weeks for BP recheck and to reassess symptoms.

## 2013-01-23 NOTE — Patient Instructions (Signed)
Restart medication as prescribed.  Return in 2 weeks for BP recheck.  Further refills will need to come from PCP Dr. Abner Greenspan.

## 2013-01-23 NOTE — Progress Notes (Signed)
Patient ID: Scott Pope, male   DOB: 1990-12-20, 22 y.o.   MRN: [id number]  Patient is a 22 year old Caucasian gentleman with history of ADHD who presents to clinic to discuss restarting Vyvanse.  Patient has been off medication since September. Wanted to try holiday from the medication to see if he can control his ADHD symptoms on his own. Patient states that his attempt has been unsuccessful.  Patient endorses hyperactivity and inattentiveness.  Symptoms are affecting his performance at work.  Patient did have history of elevated blood pressure while on Vyvanse, for which he took metoprolol 25 mg extended release tablet daily.  Patient denies chest pain, palpitations, vision changes, nausea or lightheadedness.  Patient denies change in weight.  Denies adverse effect on appetite while taking Vyvanse.   Past Medical History  Diagnosis Date  . Depression   . Overweight(278.02) 07/07/2010  . Acute low back pain 07/07/2010  . 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 ADHD (attention deficit hyperactivity disorder) 07/07/2010  . 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 GERD 01/14/2009  . ELEVATED BLOOD PRESSURE 01/14/2009  . Insomnia 08/04/2010  . Allergic state 09/04/2010  . Viral gastroenteritis 10/17/2010  . Otitis media of left ear 12/09/2010  . SOB (shortness of breath) on exertion 03/06/2011  . Skin lesion of left leg 04/01/2011  . Cough 04/06/2011  . Knee pain, left 09/24/2011  . Otitis media 11/24/2011  . Tick bite 10/17/2012  . Tinea corporis 11/22/2012  . Preventative health care 11/22/2012    Current Outpatient Prescriptions on File Prior to Visit  Medication Sig Dispense Refill  . clotrimazole-betamethasone (LOTRISONE) cream Apply topically 2 (two) times daily as needed. Rash, itchy  45 g  1  . pantoprazole (PROTONIX) 40 MG tablet TAKE 1 TABLET (40 MG TOTAL) BY MOUTH DAILY.  30 tablet  4  . ranitidine (ZANTAC) 300 MG tablet Take 1 tablet (300 mg total) by mouth at bedtime as needed for heartburn.   30 tablet  2   No current facility-administered medications on file prior to visit.    Allergies  Allergen Reactions  . Strattera [Atomoxetine Hcl]     Suicidal thoughts, depression    Family History  Problem Relation Age of Onset  . Obesity Mother   . Depression Mother   . Fibromyalgia Mother   . Hypertension Mother   . Chronic fatigue Mother   . Hypertension Father   . Fibromyalgia Maternal Grandmother   . Other Maternal Grandmother     reflux  . Other Maternal Grandfather     Lems disease  . Hypertension Paternal Grandfather   . Arthritis Paternal Grandfather   . Colon cancer Neg Hx     History   Social History  . Marital Status: Single    Spouse Name: N/A    Number of Children: N/A  . Years of Education: N/A   Social History Main Topics  . Smoking status: Never Smoker   . Smokeless tobacco: Never Used  . Alcohol Use: No  . Drug Use: No  . Sexual Activity: No   Other Topics Concern  . None   Social History Narrative  . None   Review of systems -- See HPI.  All other ROS are negative.  Filed Vitals:   01/23/13 1542  BP: 138/86  Pulse: 85  Temp: 98.7 F (37.1 C)   Physical Exam  Vitals reviewed. Constitutional: He is oriented to person, place, and time.  Overweight, well-developed in no acute distress.  HENT:  Head:  Normocephalic and atraumatic.  Right Ear: External ear normal.  Left Ear: External ear normal.  Nose: Nose normal.  Mouth/Throat: Oropharynx is clear and moist. No oropharyngeal exudate.  Eyes: Conjunctivae are normal. Pupils are equal, round, and reactive to light.  Cardiovascular: Normal rate, regular rhythm and normal heart sounds.   Pulmonary/Chest: Effort normal and breath sounds normal. No respiratory distress. He has no wheezes. He has no rales. He exhibits no tenderness.  Neurological: He is alert and oriented to person, place, and time. No cranial nerve deficit.  Skin: Skin is warm and dry. No rash noted.  Psychiatric:  Affect normal.   Assessment/Plan: ADHD (attention deficit hyperactivity disorder) Vyvanse restarted. Further refills to come from primary care provider.  Also restarted metoprolol given hx of elevated BPS while taking Vyvanse.  Followup in 2 weeks for BP recheck and to reassess symptoms.

## 2013-01-26 ENCOUNTER — Telehealth: Payer: Self-pay | Admitting: Family Medicine

## 2013-01-26 NOTE — Telephone Encounter (Signed)
Received form from insurance for prior auth, given to Tilda Franco PA

## 2013-01-26 NOTE — Telephone Encounter (Signed)
Received faxed from pharmacy that pt need prior auth for vyvanse 50mg  take 1 capsule by mouth in the morning, awaiting prior auth form to be faxed from insurance.

## 2013-02-01 NOTE — Telephone Encounter (Signed)
Insurance approved until 01/31/14, sent to nurse

## 2013-02-01 NOTE — Telephone Encounter (Signed)
PA Approval faxed to pharmacy/[facility]

## 2013-02-02 ENCOUNTER — Encounter: Payer: Self-pay | Admitting: Family Medicine

## 2013-02-02 ENCOUNTER — Ambulatory Visit (INDEPENDENT_AMBULATORY_CARE_PROVIDER_SITE_OTHER): Payer: 59 | Admitting: Family Medicine

## 2013-02-02 VITALS — BP 120/92 | HR 92 | Temp 97.6°F | Ht 73.25 in | Wt 295.0 lb

## 2013-02-02 DIAGNOSIS — R030 Elevated blood-pressure reading, without diagnosis of hypertension: Secondary | ICD-10-CM

## 2013-02-02 DIAGNOSIS — M545 Low back pain: Secondary | ICD-10-CM

## 2013-02-02 MED ORDER — METHYLPREDNISOLONE (PAK) 4 MG PO TABS: ORAL_TABLET | ORAL | Status: DC

## 2013-02-02 MED ORDER — CYCLOBENZAPRINE HCL 10 MG PO TABS: 10.0000 mg | ORAL_TABLET | Freq: Two times a day (BID) | ORAL | Status: DC | PRN

## 2013-02-02 NOTE — Progress Notes (Signed)
Pre visit review using our clinic review tool, if applicable. No additional management support is needed unless otherwise documented below in the visit note. 

## 2013-02-02 NOTE — Patient Instructions (Signed)
biofreeze or Salon Pas patches as needed  Lumbosacral Strain Lumbosacral strain is one of the most common causes of back pain. There are many causes of back pain. Most are not serious conditions. CAUSES  Your backbone (spinal column) is made up of 24 main vertebral bodies, the sacrum, and the coccyx. These are held together by muscles and tough, fibrous tissue (ligaments). Nerve roots pass through the openings between the vertebrae. A sudden move or injury to the back may cause injury to, or pressure on, these nerves. This may result in localized back pain or pain movement (radiation) into the buttocks, down the leg, and into the foot. Sharp, shooting pain from the buttock down the back of the leg (sciatica) is frequently associated with a ruptured (herniated) disk. Pain may be caused by muscle spasm alone. Your caregiver can often find the cause of your pain by the details of your symptoms and an exam. In some cases, you may need tests (such as X-rays). Your caregiver will work with you to decide if any tests are needed based on your specific exam. HOME CARE INSTRUCTIONS   Avoid an underactive lifestyle. Active exercise, as directed by your caregiver, is your greatest weapon against back pain.  Avoid hard physical activities (tennis, racquetball, waterskiing) if you are not in proper physical condition for it. This may aggravate or create problems.  If you have a back problem, avoid sports requiring sudden body movements. Swimming and walking are generally safer activities.  Maintain good posture.  Avoid becoming overweight (obese).  Use bed rest for only the most extreme, sudden (acute) episode. Your caregiver will help you determine how much bed rest is necessary.  For acute conditions, you may put ice on the injured area.  Put ice in a plastic bag.  Place a towel between your skin and the bag.  Leave the ice on for 15-20 minutes at a time, every 2 hours, or as needed.  After you are  improved and more active, it may help to apply heat for 30 minutes before activities. See your caregiver if you are having pain that lasts longer than expected. Your caregiver can advise appropriate exercises or therapy if needed. With conditioning, most back problems can be avoided. SEEK IMMEDIATE MEDICAL CARE IF:   You have numbness, tingling, weakness, or problems with the use of your arms or legs.  You experience severe back pain not relieved with medicines.  There is a change in bowel or bladder control.  You have increasing pain in any area of the body, including your belly (abdomen).  You notice shortness of breath, dizziness, or feel faint.  You feel sick to your stomach (nauseous), are throwing up (vomiting), or become sweaty.  You notice discoloration of your toes or legs, or your feet get very cold.  Your back pain is getting worse.  You have a fever. MAKE SURE YOU:   Understand these instructions.  Will watch your condition.  Will get help right away if you are not doing well or get worse. Document Released: 11/12/2004 Document Revised: 04/27/2011 Document Reviewed: 05/04/2008 [facility] Patient Information 2014 [place], [place].

## 2013-02-06 ENCOUNTER — Encounter: Payer: Self-pay | Admitting: Family Medicine

## 2013-02-06 DIAGNOSIS — M545 Low back pain: Secondary | ICD-10-CM | POA: Insufficient documentation

## 2013-02-06 NOTE — Progress Notes (Signed)
Patient ID: Scott Pope, male   DOB: 05-20-1990, 22 y.o.   MRN: [id number] Scott Pope [id number] [birth year] 02/06/2013      Progress Note-Follow Up  Subjective  Chief Complaint  Chief Complaint  Patient presents with  . Back Pain    lifted mom up this morning when she fell (mom is 340 pounds)    HPI  Patient is a 22 year old Caucasian male in today for evaluation of low back pain. His mother fell and he helped lift a 4 and has had pain since then. It is low back radiating to left hip but not down the left leg. No incontinence. Has not tried any over-the-counter medications yet.  Past Medical History  Diagnosis Date  . Depression   . Overweight(278.02) 07/07/2010  . Acute low back pain 07/07/2010  . 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 ADHD (attention deficit hyperactivity disorder) 07/07/2010  . 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 GERD 01/14/2009  . ELEVATED BLOOD PRESSURE 01/14/2009  . Insomnia 08/04/2010  . Allergic state 09/04/2010  . Viral gastroenteritis 10/17/2010  . Otitis media of left ear 12/09/2010  . SOB (shortness of breath) on exertion 03/06/2011  . Skin lesion of left leg 04/01/2011  . Cough 04/06/2011  . Knee pain, left 09/24/2011  . Otitis media 11/24/2011  . Tick bite 10/17/2012  . Tinea corporis 11/22/2012  . Preventative health care 11/22/2012    Past Surgical History  Procedure Laterality Date  . Thyroglossal duct cyst      excised at 22 years of age  . Tympanostomy tube placement      multiple   . Circumcision  AT 22YRS OLD    HAD WILD REACTION TO SEDATION MED GIVEN    Family History  Problem Relation Age of Onset  . Obesity Mother   . Depression Mother   . Fibromyalgia Mother   . Hypertension Mother   . Chronic fatigue Mother   . Hypertension Father   . Fibromyalgia Maternal Grandmother   . Other Maternal Grandmother     reflux  . Other Maternal Grandfather     Lems disease  . Hypertension Paternal Grandfather   . Arthritis Paternal  Grandfather   . Colon cancer Neg Hx     History   Social History  . Marital Status: Single    Spouse Name: N/A    Number of Children: N/A  . Years of Education: N/A   Occupational History  . Not on file.   Social History Main Topics  . Smoking status: Never Smoker   . Smokeless tobacco: Never Used  . Alcohol Use: No  . Drug Use: No  . Sexual Activity: No   Other Topics Concern  . Not on file   Social History Narrative  . No narrative on file    Current Outpatient Prescriptions on File Prior to Visit  Medication Sig Dispense Refill  . lisdexamfetamine (VYVANSE) 50 MG capsule Take 1 capsule (50 mg total) by mouth every morning. December 2014 RX  30 capsule  0  . metoprolol succinate (TOPROL-XL) 25 MG 24 hr tablet Take 1 tablet (25 mg total) by mouth daily.  90 tablet  3  . pantoprazole (PROTONIX) 40 MG tablet TAKE 1 TABLET (40 MG TOTAL) BY MOUTH DAILY.  30 tablet  4  . ranitidine (ZANTAC) 300 MG tablet Take 1 tablet (300 mg total) by mouth at bedtime as needed for heartburn.  30 tablet  2   No current facility-administered medications on file  prior to visit.    Allergies  Allergen Reactions  . Strattera [Atomoxetine Hcl]     Suicidal thoughts, depression    Review of Systems  Review of Systems  Constitutional: Negative for fever and malaise/fatigue.  HENT: Negative for congestion.   Eyes: Negative for discharge.  Respiratory: Negative for shortness of breath.   Cardiovascular: Negative for chest pain, palpitations and leg swelling.  Gastrointestinal: Negative for nausea, abdominal pain and diarrhea.  Genitourinary: Negative for dysuria.  Musculoskeletal: Positive for back pain. Negative for falls.       Low back pain  Skin: Negative for rash.  Neurological: Negative for loss of consciousness and headaches.  Endo/Heme/Allergies: Negative for polydipsia.  Psychiatric/Behavioral: Negative for depression and suicidal ideas. The patient is not nervous/anxious and  does not have insomnia.     Objective  BP 120/92  Pulse 92  Temp(Src) 97.6 F (36.4 C) (Oral)  Ht 6' 1.25" (1.861 m)  Wt 295 lb 0.6 oz (133.829 kg)  BMI 38.64 kg/m2  SpO2 94%  Physical Exam  Physical Exam  Constitutional: He is oriented to person, place, and time and well-developed, well-nourished, and in no distress. No distress.  HENT:  Head: Normocephalic and atraumatic.  Eyes: Conjunctivae are normal.  Neck: Neck supple. No thyromegaly present.  Cardiovascular: Normal rate, regular rhythm and normal heart sounds.   No murmur heard. Pulmonary/Chest: Effort normal and breath sounds normal. No respiratory distress.  Abdominal: He exhibits no distension and no mass. There is no tenderness.  Musculoskeletal: He exhibits no edema.  Neurological: He is alert and oriented to person, place, and time.  Skin: Skin is warm.  Psychiatric: Memory, affect and judgment normal.      Assessment & Plan  ELEVATED BLOOD PRESSURE Well controlled, no changes  Low back pain Encouraged no heavy lifting this week. Started on Medrol dosepak and Flexeril, report persistent symptoms

## 2013-02-06 NOTE — Assessment & Plan Note (Signed)
Encouraged no heavy lifting this week. Started on Medrol dosepak and Flexeril, report persistent symptoms

## 2013-02-06 NOTE — Assessment & Plan Note (Signed)
Well controlled, no changes 

## 2013-02-21 ENCOUNTER — Encounter: Payer: Self-pay | Admitting: Family Medicine

## 2013-02-21 ENCOUNTER — Ambulatory Visit (INDEPENDENT_AMBULATORY_CARE_PROVIDER_SITE_OTHER): Payer: 59 | Admitting: Family Medicine

## 2013-02-21 VITALS — BP 128/86 | HR 75 | Temp 98.2°F | Ht 73.25 in | Wt 293.0 lb

## 2013-02-21 DIAGNOSIS — F909 Attention-deficit hyperactivity disorder, unspecified type: Secondary | ICD-10-CM

## 2013-02-21 DIAGNOSIS — F988 Other specified behavioral and emotional disorders with onset usually occurring in childhood and adolescence: Secondary | ICD-10-CM

## 2013-02-21 DIAGNOSIS — R030 Elevated blood-pressure reading, without diagnosis of hypertension: Secondary | ICD-10-CM

## 2013-02-21 DIAGNOSIS — M545 Low back pain, unspecified: Secondary | ICD-10-CM

## 2013-02-21 DIAGNOSIS — K219 Gastro-esophageal reflux disease without esophagitis: Secondary | ICD-10-CM

## 2013-02-21 MED ORDER — LISDEXAMFETAMINE DIMESYLATE 50 MG PO CAPS: 50.0000 mg | ORAL_CAPSULE | Freq: Every day | ORAL | Status: DC

## 2013-02-21 MED ORDER — LISDEXAMFETAMINE DIMESYLATE 50 MG PO CAPS: 50.0000 mg | ORAL_CAPSULE | ORAL | Status: DC

## 2013-02-21 NOTE — Patient Instructions (Signed)
DASH Diet  The DASH diet stands for "Dietary Approaches to Stop Hypertension." It is a healthy eating plan that has been shown to reduce high blood pressure (hypertension) in as little as 14 days, while also possibly providing other significant health benefits. These other health benefits include reducing the risk of breast cancer after menopause and reducing the risk of type 2 diabetes, heart disease, colon cancer, and stroke. Health benefits also include weight loss and slowing kidney failure in patients with chronic kidney disease.   DIET GUIDELINES  · Limit salt (sodium). Your diet should contain less than 1500 mg of sodium daily.  · Limit refined or processed carbohydrates. Your diet should include mostly whole grains. Desserts and added sugars should be used sparingly.  · Include small amounts of heart-healthy fats. These types of fats include nuts, oils, and tub margarine. Limit saturated and trans fats. These fats have been shown to be harmful in the body.  CHOOSING FOODS   The following food groups are based on a 2000 calorie diet. See your Registered Dietitian for individual calorie needs.  Grains and Grain Products (6 to 8 servings daily)  · Eat More Often: Whole-wheat bread, brown rice, whole-grain or wheat pasta, quinoa, popcorn without added fat or salt (air popped).  · Eat Less Often: White bread, white pasta, white rice, cornbread.  Vegetables (4 to 5 servings daily)  · Eat More Often: Fresh, frozen, and canned vegetables. Vegetables may be raw, steamed, roasted, or grilled with a minimal amount of fat.  · Eat Less Often/Avoid: Creamed or fried vegetables. Vegetables in a cheese sauce.  Fruit (4 to 5 servings daily)  · Eat More Often: All fresh, canned (in natural juice), or frozen fruits. Dried fruits without added sugar. One hundred percent fruit juice (½ cup [237 mL] daily).  · Eat Less Often: Dried fruits with added sugar. Canned fruit in light or heavy syrup.  Lean Meats, Fish, and Poultry (2  servings or less daily. One serving is 3 to 4 oz [85-114 g]).  · Eat More Often: Ninety percent or leaner ground beef, tenderloin, sirloin. Round cuts of beef, chicken breast, turkey breast. All fish. Grill, bake, or broil your meat. Nothing should be fried.  · Eat Less Often/Avoid: Fatty cuts of meat, turkey, or chicken leg, thigh, or wing. Fried cuts of meat or fish.  Dairy (2 to 3 servings)  · Eat More Often: Low-fat or fat-free milk, low-fat plain or light yogurt, reduced-fat or part-skim cheese.  · Eat Less Often/Avoid: Milk (whole, 2%). Whole milk yogurt. Full-fat cheeses.  Nuts, Seeds, and Legumes (4 to 5 servings per week)  · Eat More Often: All without added salt.  · Eat Less Often/Avoid: Salted nuts and seeds, canned beans with added salt.  Fats and Sweets (limited)  · Eat More Often: Vegetable oils, tub margarines without trans fats, sugar-free gelatin. Mayonnaise and salad dressings.  · Eat Less Often/Avoid: Coconut oils, palm oils, butter, stick margarine, cream, half and half, cookies, candy, pie.  FOR MORE INFORMATION  The Dash Diet Eating Plan: www.dashdiet.org  Document Released: 01/22/2011 Document Revised: 04/27/2011 Document Reviewed: 01/22/2011  [employer]® Patient Information ©2014 [employer].

## 2013-02-21 NOTE — Progress Notes (Signed)
Pre visit review using our clinic review tool, if applicable. No additional management support is needed unless otherwise documented below in the visit note. 

## 2013-02-22 NOTE — Progress Notes (Signed)
Patient ID: Scott Pope, male   DOB: 1990-07-18, 23 y.o.   MRN: [id number] Scott Pope [id number] Dec 26, 1990 02/22/2013      Progress Note-Follow Up  Subjective  Chief Complaint  Chief Complaint  Patient presents with  . Follow-up    on Vyvanse    HPI  Patient is to-year-old Caucasian male who is in today in followup he is responding well to the increase in by aunt. He reports improved concentration inability to finish tasks. He denies any headaches, chest pain palpitations shortness or breath or GI complaints. Taking medications as prescribed. His low back pain is resolved with conservative management he is no longer needing medications.  Past Medical History  Diagnosis Date  . Depression   . Overweight(278.02) 07/07/2010  . Acute low back pain 07/07/2010  . 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 ADHD (attention deficit hyperactivity disorder) 07/07/2010  . 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 GERD 01/14/2009  . ELEVATED BLOOD PRESSURE 01/14/2009  . Insomnia 08/04/2010  . Allergic state 09/04/2010  . Viral gastroenteritis 10/17/2010  . Otitis media of left ear 12/09/2010  . SOB (shortness of breath) on exertion 03/06/2011  . Skin lesion of left leg 04/01/2011  . Cough 04/06/2011  . Knee pain, left 09/24/2011  . Otitis media 11/24/2011  . Tick bite 10/17/2012  . Tinea corporis 11/22/2012  . Preventative health care 11/22/2012    Past Surgical History  Procedure Laterality Date  . Thyroglossal duct cyst      excised at 23 years of age  . Tympanostomy tube placement      multiple   . Circumcision  AT 23YRS OLD    HAD WILD REACTION TO SEDATION MED GIVEN    Family History  Problem Relation Age of Onset  . Obesity Mother   . Depression Mother   . Fibromyalgia Mother   . Hypertension Mother   . Chronic fatigue Mother   . Hypertension Father   . Fibromyalgia Maternal Grandmother   . Other Maternal Grandmother     reflux  . Other Maternal Grandfather     Lems disease  .  Hypertension Paternal Grandfather   . Arthritis Paternal Grandfather   . Colon cancer Neg Hx     History   Social History  . Marital Status: Single    Spouse Name: N/A    Number of Children: N/A  . Years of Education: N/A   Occupational History  . Not on file.   Social History Main Topics  . Smoking status: Never Smoker   . Smokeless tobacco: Never Used  . Alcohol Use: No  . Drug Use: No  . Sexual Activity: No   Other Topics Concern  . Not on file   Social History Narrative  . No narrative on file    Current Outpatient Prescriptions on File Prior to Visit  Medication Sig Dispense Refill  . metoprolol succinate (TOPROL-XL) 25 MG 24 hr tablet Take 1 tablet (25 mg total) by mouth daily.  90 tablet  3  . pantoprazole (PROTONIX) 40 MG tablet TAKE 1 TABLET (40 MG TOTAL) BY MOUTH DAILY.  30 tablet  4  . ranitidine (ZANTAC) 300 MG tablet Take 1 tablet (300 mg total) by mouth at bedtime as needed for heartburn.  30 tablet  2   No current facility-administered medications on file prior to visit.    Allergies  Allergen Reactions  . Strattera [Atomoxetine Hcl]     Suicidal thoughts, depression    Review of  Systems  Review of Systems  Constitutional: Negative for fever and malaise/fatigue.  HENT: Negative for congestion.   Eyes: Negative for discharge.  Respiratory: Negative for shortness of breath.   Cardiovascular: Negative for chest pain, palpitations and leg swelling.  Gastrointestinal: Negative for nausea, abdominal pain and diarrhea.  Genitourinary: Negative for dysuria.  Musculoskeletal: Negative for falls.  Skin: Negative for rash.  Neurological: Negative for loss of consciousness and headaches.  Endo/Heme/Allergies: Negative for polydipsia.  Psychiatric/Behavioral: Negative for depression and suicidal ideas. The patient is not nervous/anxious and does not have insomnia.     Objective  BP 128/86  Pulse 75  Temp(Src) 98.2 F (36.8 C) (Oral)  Ht 6' 1.25"  (1.861 m)  Wt 293 lb (132.904 kg)  BMI 38.37 kg/m2  SpO2 98%  Physical Exam  Physical Exam  Constitutional: He is oriented to person, place, and time and well-developed, well-nourished, and in no distress. No distress.  HENT:  Head: Normocephalic and atraumatic.  Eyes: Conjunctivae are normal.  Neck: Neck supple. No thyromegaly present.  Cardiovascular: Normal rate, regular rhythm and normal heart sounds.   No murmur heard. Pulmonary/Chest: Effort normal and breath sounds normal. No respiratory distress.  Abdominal: He exhibits no distension and no mass. There is no tenderness.  Musculoskeletal: He exhibits no edema.  Neurological: He is alert and oriented to person, place, and time.  Skin: Skin is warm.  Psychiatric: Memory, affect and judgment normal.     Assessment & Plan  ADHD (attention deficit hyperactivity disorder) Good response to restarting Vyvanse, given refils today  Acute low back pain Resolved with conservative management.  GERD Improves with dietary restriction, tolerating Ranitidine.   ELEVATED BLOOD PRESSURE Improved with recheck no changes

## 2013-02-22 NOTE — Assessment & Plan Note (Signed)
Improved with recheck no changes

## 2013-02-22 NOTE — Assessment & Plan Note (Signed)
Resolved with conservative management.

## 2013-02-22 NOTE — Assessment & Plan Note (Signed)
Good response to restarting Vyvanse, given refils today

## 2013-02-22 NOTE — Assessment & Plan Note (Signed)
Improves with dietary restriction, tolerating Ranitidine.

## 2013-05-29 ENCOUNTER — Telehealth: Payer: Self-pay | Admitting: Family Medicine

## 2013-05-29 NOTE — Telephone Encounter (Signed)
Patient is requesting a new prescription of vyvance, next scheduled appointment is 06/20/13

## 2013-05-30 ENCOUNTER — Ambulatory Visit: Payer: 59 | Admitting: Family Medicine

## 2013-05-30 MED ORDER — LISDEXAMFETAMINE DIMESYLATE 50 MG PO CAPS
50.0000 mg | ORAL_CAPSULE | Freq: Every day | ORAL | Status: DC
Start: 2013-05-30 — End: 2013-07-14

## 2013-05-30 NOTE — Telephone Encounter (Signed)
1 RX for April printed and put on md's desk to sign  Pt informed and stated to give the RX to his mom today at her appt.

## 2013-06-20 ENCOUNTER — Ambulatory Visit: Payer: 59 | Admitting: Family Medicine

## 2013-06-20 DIAGNOSIS — Z0289 Encounter for other administrative examinations: Secondary | ICD-10-CM

## 2013-06-22 ENCOUNTER — Other Ambulatory Visit: Payer: Self-pay | Admitting: Internal Medicine

## 2013-07-14 ENCOUNTER — Encounter: Payer: Self-pay | Admitting: Physician Assistant

## 2013-07-14 ENCOUNTER — Ambulatory Visit (INDEPENDENT_AMBULATORY_CARE_PROVIDER_SITE_OTHER): Payer: 59 | Admitting: Physician Assistant

## 2013-07-14 VITALS — BP 128/82 | HR 81 | Temp 98.2°F | Resp 16 | Ht 73.25 in | Wt 294.8 lb

## 2013-07-14 DIAGNOSIS — F909 Attention-deficit hyperactivity disorder, unspecified type: Secondary | ICD-10-CM

## 2013-07-14 MED ORDER — LISDEXAMFETAMINE DIMESYLATE 50 MG PO CAPS: 50.0000 mg | ORAL_CAPSULE | Freq: Every day | ORAL | Status: DC

## 2013-07-14 NOTE — Progress Notes (Signed)
Pre visit review using our clinic review tool, if applicable. No additional management support is needed unless otherwise documented below in the visit note/SLS  

## 2013-07-14 NOTE — Assessment & Plan Note (Signed)
Doing well.  Continue current regimen.  Will give Rx for June, July and August.

## 2013-07-14 NOTE — Progress Notes (Signed)
Patient presents to clinic today for medication refill. Patient currently on Vyvanse 50 mg daily for ADHD symptoms.  Has been stable for some time on current dose.  Endorses good relief of symptoms. Take occasional drug holiday.  Denies chest pain, palpitations, anorexia or insomnia while on medication. Patient without acute concerns at today's visit.   Past Medical History  Diagnosis Date  . Depression   . Overweight 07/07/2010  . Acute low back pain 07/07/2010  . 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 ADHD (attention deficit hyperactivity disorder) 07/07/2010  . 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 GERD 01/14/2009  . ELEVATED BLOOD PRESSURE 01/14/2009  . Insomnia 08/04/2010  . Allergic state 09/04/2010  . Viral gastroenteritis 10/17/2010  . Otitis media of left ear 12/09/2010  . SOB (shortness of breath) on exertion 03/06/2011  . Skin lesion of left leg 04/01/2011  . Cough 04/06/2011  . Knee pain, left 09/24/2011  . Otitis media 11/24/2011  . Tick bite 10/17/2012  . Tinea corporis 11/22/2012  . Preventative health care 11/22/2012    Current Outpatient Prescriptions on File Prior to Visit  Medication Sig Dispense Refill  . metoprolol succinate (TOPROL-XL) 25 MG 24 hr tablet Take 1 tablet (25 mg total) by mouth daily.  90 tablet  3  . pantoprazole (PROTONIX) 40 MG tablet TAKE 1 TABLET (40 MG TOTAL) BY MOUTH DAILY.  30 tablet  4  . ranitidine (ZANTAC) 300 MG tablet Take 1 tablet (300 mg total) by mouth at bedtime as needed for heartburn.  30 tablet  2   No current facility-administered medications on file prior to visit.    Allergies  Allergen Reactions  . Strattera [Atomoxetine Hcl]     Suicidal thoughts, depression    Family History  Problem Relation Age of Onset  . Obesity Mother   . Depression Mother   . Fibromyalgia Mother   . Hypertension Mother   . Chronic fatigue Mother   . Hypertension Father   . Fibromyalgia Maternal Grandmother   . Other Maternal Grandmother     reflux   . Other Maternal Grandfather     Lems disease  . Hypertension Paternal Grandfather   . Arthritis Paternal Grandfather   . Colon cancer Neg Hx     History   Social History  . Marital Status: Single    Spouse Name: N/A    Number of Children: N/A  . Years of Education: N/A   Social History Main Topics  . Smoking status: Never Smoker   . Smokeless tobacco: Never Used  . Alcohol Use: No  . Drug Use: No  . Sexual Activity: No   Other Topics Concern  . None   Social History Narrative  . None   Review of Systems - See HPI.  All other ROS are negative.  BP 128/82  Pulse 81  Temp(Src) 98.2 F (36.8 C) (Oral)  Resp 16  Ht 6' 1.25" (1.861 m)  Wt 294 lb 12 oz (133.698 kg)  BMI 38.60 kg/m2  SpO2 96%  Physical Exam  Vitals reviewed. Constitutional: He is oriented to person, place, and time and well-developed, well-nourished, and in no distress.  HENT:  Head: Normocephalic and atraumatic.  Eyes: Conjunctivae are normal. Pupils are equal, round, and reactive to light.  Cardiovascular: Normal rate, regular rhythm, normal heart sounds and intact distal pulses.   Pulmonary/Chest: Effort normal and breath sounds normal. No respiratory distress.  Neurological: He is alert and oriented to person, place, and time.  Skin: Skin  is warm and dry. No rash noted.  Psychiatric: Affect normal.   Assessment/Plan: ADHD (attention deficit hyperactivity disorder) Doing well.  Continue current regimen.  Will give Rx for June, July and August.

## 2013-07-14 NOTE — Patient Instructions (Signed)
Please continue medication as directed.  Follow-up with Dr. Abner Greenspan for further refills.  It was a pleasure participating in your care today.    Attention Deficit Hyperactivity Disorder Attention deficit hyperactivity disorder (ADHD) is a problem with behavior issues based on the way the brain functions (neurobehavioral disorder). It is a common reason for behavior and academic problems in school. SYMPTOMS  There are 3 types of ADHD. The 3 types and some of the symptoms include:  Inattentive  Gets bored or distracted easily.  Loses or forgets things. Forgets to hand in homework.  Has trouble organizing or completing tasks.  Difficulty staying on task.  An inability to organize daily tasks and school work.  Leaving projects, chores, or homework unfinished.  Trouble paying attention or responding to details. Careless mistakes.  Difficulty following directions. Often seems like is not listening.  Dislikes activities that require sustained attention (like chores or homework).  Hyperactive-impulsive  Feels like it is impossible to sit still or stay in a seat. Fidgeting with hands and feet.  Trouble waiting turn.  Talking too much or out of turn. Interruptive.  Speaks or acts impulsively.  Aggressive, disruptive behavior.  Constantly busy or on the go, noisy.  Often leaves seat when they are expected to remain seated.  Often runs or climbs where it is not appropriate, or feels very restless.  Combined  Has symptoms of both of the above. Often children with ADHD feel discouraged about themselves and with school. They often perform well below their abilities in school. As children get older, the excess motor activities can calm down, but the problems with paying attention and staying organized persist. Most children do not outgrow ADHD but with good treatment can learn to cope with the symptoms. DIAGNOSIS  When ADHD is suspected, the diagnosis should be made by professionals  trained in ADHD. This professional will collect information about the individual suspected of having ADHD. Information must be collected from various settings where the person lives, works, or attends school.  Diagnosis will include:  Confirming symptoms began in childhood.  Ruling out other reasons for the child's behavior.  The health care providers will check with the child's school and check their medical records.  They will talk to teachers and parents.  Behavior rating scales for the child will be filled out by those dealing with the child on a daily basis. A diagnosis is made only after all information has been considered. TREATMENT  Treatment usually includes behavioral treatment, tutoring or extra support in school, and stimulant medicines. Because of the way a person's brain works with ADHD, these medicines decrease impulsivity and hyperactivity and increase attention. This is different than how they would work in a person who does not have ADHD. Other medicines used include antidepressants and certain blood pressure medicines. Most experts agree that treatment for ADHD should address all aspects of the person's functioning. Along with medicines, treatment should include structured classroom management at school. Parents should reward good behavior, provide constant discipline, and limit-setting. Tutoring should be available for the child as needed. ADHD is a life-long condition. If untreated, the disorder can have long-term serious effects into adolescence and adulthood. HOME CARE INSTRUCTIONS   Often with ADHD there is a lot of frustration among family members dealing with the condition. Blame and anger are also feelings that are common. In many cases, because the problem affects the family as a whole, the entire family may need help. A therapist can help the family find better  ways to handle the disruptive behaviors of the person with ADHD and promote change. If the person with ADHD  is young, most of the therapist's work is with the parents. Parents will learn techniques for coping with and improving their child's behavior. Sometimes only the child with the ADHD needs counseling. Your health care providers can help you make these decisions.  Children with ADHD may need help learning how to organize. Some helpful tips include:  Keep routines the same every day from wake-up time to bedtime. Schedule all activities, including homework and playtime. Keep the schedule in a place where the person with ADHD will often see it. Mark schedule changes as far in advance as possible.  Schedule outdoor and indoor recreation.  Have a place for everything and keep everything in its place. This includes clothing, backpacks, and school supplies.  Encourage writing down assignments and bringing home needed books. Work with your child's teachers for assistance in organizing school work.  Offer your child a well-balanced diet. Breakfast that includes a balance of whole grains, protein and, fruits or vegetables is especially important for school performance. Children should avoid drinks with caffeine including:  Soft drinks.  Coffee.  Tea.  However, some older children (adolescents) may find these drinks helpful in improving their attention. Because it can also be common for adolescents with ADHD to become addicted to caffeine, talk with your health care provider about what is a safe amount of caffeine intake for your child.  Children with ADHD need consistent rules that they can understand and follow. If rules are followed, give small rewards. Children with ADHD often receive, and expect, criticism. Look for good behavior and praise it. Set realistic goals. Give clear instructions. Look for activities that can foster success and self-esteem. Make time for pleasant activities with your child. Give lots of affection.  Parents are their children's greatest advocates. Learn as much as possible  about ADHD. This helps you become a stronger and better advocate for your child. It also helps you educate your child's teachers and instructors if they feel inadequate in these areas. Parent support groups are often helpful. A national group with local chapters is called Children and Adults with Attention Deficit Hyperactivity Disorder (CHADD). SEEK MEDICAL CARE IF:  Your child has repeated muscle twitches, cough or speech outbursts.  Your child has sleep problems.  Your child has a marked loss of appetite.  Your child develops depression.  Your child has new or worsening behavioral problems.  Your child develops dizziness.  Your child has a racing heart.  Your child has stomach pains.  Your child develops headaches. SEEK IMMEDIATE MEDICAL CARE IF:  Your child has been diagnosed with depression or anxiety and the symptoms seem to be getting worse.  Your child has been depressed and suddenly appears to have increased energy or motivation.  You are worried that your child is having a bad reaction to a medication he or she is taking for ADHD. Document Released: 01/23/2002 Document Revised: 11/23/2012 Document Reviewed: 10/10/2012 [facility] Patient Information 2014 [employer], [place].

## 2013-08-17 ENCOUNTER — Telehealth: Payer: Self-pay | Admitting: Family Medicine

## 2013-08-17 NOTE — Telephone Encounter (Signed)
Patient called in stating that he has a knot on his head and has been having headaches. Our office is full and I offered patient [facility]. Patient states that he has to work today and cannot come in. Transferred call to Wandalee FerdinandMary Catherine at [facility] to evaluate patient symptoms.

## 2013-08-17 NOTE — Telephone Encounter (Signed)
Patient Information:  Caller Name: Karleen HampshireSpencer  Phone: [phone]  Patient: Scott Pope, Scott Pope  Gender: Male  DOB: 11/01/1990  Age: [age] Years  PCP: Danise EdgeBlyth, [facility](Family Practice)  Office Follow Up:  Does the office need to follow up with this patient?: No  Instructions For The Office: N/A  RN Note:  Papular lesion is red without drainage. Was told it loks like a pimple without a white head yet. Instructed not to squeeze lesion.   Symptoms  Reason For Call & Symptoms: Emergent call:  Reports knot "like a zit" on posterior head for 2-3 days that causes head discomfort at times.  Reviewed Health History In EMR: Yes  Reviewed Medications In EMR: Yes  Reviewed Allergies In EMR: Yes  Reviewed Surgeries / Procedures: Yes  Date of Onset of Symptoms: 08/14/2013  Treatments Tried: Tylenol  Treatments Tried Worked: No  Guideline(s) Used:  Skin Lesion - Moles or Growths  Disposition Per Guideline:   See Today in Office  Reason For Disposition Reached:   Looks like a boil, infected sore, or deep ulcer  Advice Given:  Call Back If:  Fever or pain occurs  Any change in the mole or growth  You become worse.  Patient Refused Recommendation:  Patient Will Follow Up With Office Later  After initially requesting AM appointment for 08/17/13, and no appointments were available at [facility] or [place] offices, he declined next [facility] 1400 saying he would "just wait" until 08/24/13 appointment.

## 2013-08-17 NOTE — Telephone Encounter (Signed)
FYI

## 2013-08-24 ENCOUNTER — Ambulatory Visit (INDEPENDENT_AMBULATORY_CARE_PROVIDER_SITE_OTHER): Payer: 59 | Admitting: Family Medicine

## 2013-08-24 ENCOUNTER — Encounter: Payer: Self-pay | Admitting: Family Medicine

## 2013-08-24 VITALS — BP 122/86 | HR 80 | Temp 97.6°F | Ht 73.25 in | Wt 282.0 lb

## 2013-08-24 DIAGNOSIS — K589 Irritable bowel syndrome without diarrhea: Secondary | ICD-10-CM

## 2013-08-24 DIAGNOSIS — R030 Elevated blood-pressure reading, without diagnosis of hypertension: Secondary | ICD-10-CM

## 2013-08-24 DIAGNOSIS — K219 Gastro-esophageal reflux disease without esophagitis: Secondary | ICD-10-CM

## 2013-08-24 NOTE — Patient Instructions (Signed)
Benefiber powder twice daily and  A probiotic such as Digestive Advantage or [employer]'s Colon Health daily   Irritable Bowel Syndrome Irritable bowel syndrome (IBS) is caused by a disturbance of normal bowel function and is a common digestive disorder. You may also hear this condition called spastic colon, mucous colitis, and irritable colon. There is no cure for IBS. However, symptoms often gradually improve or disappear with a good diet, stress management, and medicine. This condition usually appears in late adolescence or early adulthood. Women develop it twice as often as men. CAUSES  After food has been digested and absorbed in the small intestine, waste material is moved into the large intestine, or colon. In the colon, water and salts are absorbed from the undigested products coming from the small intestine. The remaining residue, or fecal material, is held for elimination. Under normal circumstances, gentle, rhythmic contractions of the bowel walls push the fecal material along the colon toward the rectum. In IBS, however, these contractions are irregular and poorly coordinated. The fecal material is either retained too long, resulting in constipation, or expelled too soon, producing diarrhea. SIGNS AND SYMPTOMS  The most common symptom of IBS is abdominal pain. It is often in the lower left side of the abdomen, but it may occur anywhere in the abdomen. The pain comes from spasms of the bowel muscles happening too much and from the buildup of gas and fecal material in the colon. This pain:  Can range from sharp abdominal cramps to a dull, continuous ache.  Often worsens soon after eating.  Is often relieved by having a bowel movement or passing gas. Abdominal pain is usually accompanied by constipation, but it may also produce diarrhea. The diarrhea often occurs right after a meal or upon waking up in the morning. The stools are often soft, watery, and flecked with mucus. Other symptoms of IBS  include:  Bloating.  Loss of appetite.  Heartburn.  Backache.  Dull pain in the arms or shoulders.  Nausea.  Burping.  Vomiting.  Gas. IBS may also cause symptoms that are unrelated to the digestive system, such as:  Fatigue.  Headaches.  Anxiety.  Shortness of breath.  Trouble concentrating.  Dizziness. These symptoms tend to come and go. DIAGNOSIS  The symptoms of IBS may seem like symptoms of other, more serious digestive disorders. Your health care provider may want to perform tests to exclude these disorders.  TREATMENT Many medicines are available to help correct bowel function or relieve bowel spasms and abdominal pain. Among the medicines available are:  Laxatives for severe constipation and to help restore normal bowel habits.  Specific antidiarrheal medicines to treat severe or lasting diarrhea.  Antispasmodic agents to relieve intestinal cramps. Your health care provider may also decide to treat you with a mild tranquilizer or sedative during unusually stressful periods in your life. Your health care provider may also prescribe antidepressant medicine. The use of this medicine has been shown to reduce pain and other symptoms of IBS. Remember that if any medicine is prescribed for you, you should take it exactly as directed. Make sure your health care provider knows how well it worked for you. HOME CARE INSTRUCTIONS   Take all medicines as directed by your health care provider.  Avoid foods that are high in fat or oils, such as heavy cream, butter, frankfurters, sausage, and other fatty meats.  Avoid foods that make you go to the bathroom, such as fruit, fruit juice, and dairy products.  Cut out  carbonated drinks, chewing gum, and "gassy" foods such as beans and cabbage. This may help relieve bloating and burping.  Eat foods with bran, and drink plenty of liquids with the bran foods. This helps relieve constipation.  Keep track of what foods seem to  bring on your symptoms.  Avoid emotionally charged situations or circumstances that produce anxiety.  Start or continue exercising.  Get plenty of rest and sleep. Document Released: 02/02/2005 Document Revised: 02/07/2013 Document Reviewed: 09/23/2007 [facility] Patient Information 2015 [place], [place]. This information is not intended to replace advice given to you by your health care provider. Make sure you discuss any questions you have with your health care provider.

## 2013-08-25 ENCOUNTER — Encounter: Payer: Self-pay | Admitting: Family Medicine

## 2013-08-25 DIAGNOSIS — K589 Irritable bowel syndrome without diarrhea: Secondary | ICD-10-CM

## 2013-08-25 HISTORY — DX: Irritable bowel syndrome, unspecified: K58.9

## 2013-08-25 NOTE — Progress Notes (Signed)
Patient ID: Scott Pope Weisenberger, male   DOB: 10/16/1990, 23 y.o.   MRN: [id number] Scott Pope Imran [id number] 02/06/1991 08/25/2013      Progress Note-Follow Up  Subjective  Chief Complaint  Chief Complaint  Patient presents with  . Irritable Bowel Syndrome    HPI  Patient is a 23 year old male in today for routine medical care. In today complaining of irritable bowel type symptoms. He will go for several days with numerous loose stool followed by several days of no bowel movement. No bloody or tarry stool. Some nausea and occasional heartburn no vomiting. No fevers or chills. When he's not having diarrhea he has no abdominal pain. Frequent loose stools he has some cramping. No other acute complaints. No other recent illness. Denies CP/palp/SOB/HA/congestion/fevers GU c/o. Taking meds as prescribed  Past Medical History  Diagnosis Date  . Depression   . Overweight(278.02) 07/07/2010  . Acute low back pain 07/07/2010  . 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 ADHD (attention deficit hyperactivity disorder) 07/07/2010  . 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 GERD 01/14/2009  . ELEVATED BLOOD PRESSURE 01/14/2009  . Insomnia 08/04/2010  . Allergic state 09/04/2010  . Viral gastroenteritis 10/17/2010  . Otitis media of left ear 12/09/2010  . SOB (shortness of breath) on exertion 03/06/2011  . Skin lesion of left leg 04/01/2011  . Cough 04/06/2011  . Knee pain, left 09/24/2011  . Otitis media 11/24/2011  . Tick bite 10/17/2012  . Tinea corporis 11/22/2012  . Preventative health care 11/22/2012  . IBS (irritable bowel syndrome) 08/25/2013    Past Surgical History  Procedure Laterality Date  . Thyroglossal duct cyst      excised at 23 years of age  . Tympanostomy tube placement      multiple   . Circumcision  AT 23YRS OLD    HAD WILD REACTION TO SEDATION MED GIVEN    Family History  Problem Relation Age of Onset  . Obesity Mother   . Depression Mother   . Fibromyalgia Mother   . Hypertension  Mother   . Chronic fatigue Mother   . Hypertension Father   . Fibromyalgia Maternal Grandmother   . Other Maternal Grandmother     reflux  . Other Maternal Grandfather     Lems disease  . Hypertension Paternal Grandfather   . Arthritis Paternal Grandfather   . Colon cancer Neg Hx     History   Social History  . Marital Status: Single    Spouse Name: N/A    Number of Children: N/A  . Years of Education: N/A   Occupational History  . Not on file.   Social History Main Topics  . Smoking status: Never Smoker   . Smokeless tobacco: Never Used  . Alcohol Use: No  . Drug Use: No  . Sexual Activity: No   Other Topics Concern  . Not on file   Social History Narrative  . No narrative on file    Current Outpatient Prescriptions on File Prior to Visit  Medication Sig Dispense Refill  . lisdexamfetamine (VYVANSE) 50 MG capsule Take 1 capsule (50 mg total) by mouth daily. July-August Rx. Can be filled 09/12/13  30 capsule  0  . metoprolol succinate (TOPROL-XL) 25 MG 24 hr tablet Take 1 tablet (25 mg total) by mouth daily.  90 tablet  3  . pantoprazole (PROTONIX) 40 MG tablet TAKE 1 TABLET (40 MG TOTAL) BY MOUTH DAILY.  30 tablet  4  . ranitidine (  ZANTAC) 300 MG tablet Take 1 tablet (300 mg total) by mouth at bedtime as needed for heartburn.  30 tablet  2   No current facility-administered medications on file prior to visit.    Allergies  Allergen Reactions  . Strattera [Atomoxetine Hcl]     Suicidal thoughts, depression    Review of Systems  Review of Systems  Constitutional: Negative for fever and malaise/fatigue.  HENT: Negative for congestion.   Eyes: Negative for discharge.  Respiratory: Negative for shortness of breath.   Cardiovascular: Negative for chest pain, palpitations and leg swelling.  Gastrointestinal: Positive for heartburn, diarrhea and constipation. Negative for nausea and abdominal pain.  Genitourinary: Negative for dysuria.  Musculoskeletal:  Negative for falls.  Skin: Negative for rash.  Neurological: Negative for loss of consciousness and headaches.  Endo/Heme/Allergies: Negative for polydipsia.  Psychiatric/Behavioral: Negative for depression and suicidal ideas. The patient is not nervous/anxious and does not have insomnia.     Objective  BP 122/86  Pulse 80  Temp(Src) 97.6 F (36.4 C) (Oral)  Ht 6' 1.25" (1.861 m)  Wt 282 lb (127.914 kg)  BMI 36.93 kg/m2  SpO2 94%  Physical Exam  Physical Exam  Constitutional: He is oriented to person, place, and time and well-developed, well-nourished, and in no distress. No distress.  HENT:  Head: Normocephalic and atraumatic.  Eyes: Conjunctivae are normal.  Neck: Neck supple. No thyromegaly present.  Cardiovascular: Normal rate, regular rhythm and normal heart sounds.   No murmur heard. Pulmonary/Chest: Effort normal and breath sounds normal. No respiratory distress.  Abdominal: He exhibits no distension and no mass. There is no tenderness.  Musculoskeletal: He exhibits no edema.  Neurological: He is alert and oriented to person, place, and time.  Skin: Skin is warm.  Psychiatric: Memory, affect and judgment normal.      Assessment & Plan  GERD Avoid offending foods, start probiotics. Do not eat large meals in late evening and consider raising head of bed.   ELEVATED BLOOD PRESSURE Well controlled, no changes to meds. Encouraged heart healthy diet such as the DASH diet and exercise as tolerated.   IBS (irritable bowel syndrome) Avoid offending foods, start probiotics. Do not eat large meals in late evening and consider raising head of bed. Add Benefiber bid and let us know if not improved

## 2013-08-25 NOTE — Assessment & Plan Note (Signed)
Avoid offending foods, start probiotics. Do not eat large meals in late evening and consider raising head of bed. Add Benefiber bid and let us know if not improved

## 2013-08-25 NOTE — Assessment & Plan Note (Signed)
Avoid offending foods, start probiotics. Do not eat large meals in late evening and consider raising head of bed.  

## 2013-08-25 NOTE — Assessment & Plan Note (Signed)
Well controlled, no changes to meds. Encouraged heart healthy diet such as the DASH diet and exercise as tolerated.  °

## 2013-10-30 ENCOUNTER — Telehealth: Payer: Self-pay | Admitting: Family Medicine

## 2013-10-30 MED ORDER — LISDEXAMFETAMINE DIMESYLATE 50 MG PO CAPS: 50.0000 mg | ORAL_CAPSULE | Freq: Every day | ORAL | Status: DC

## 2013-10-30 NOTE — Telephone Encounter (Signed)
He can have a refill but then he will need an appt in next month to check bp on it.

## 2013-10-30 NOTE — Telephone Encounter (Signed)
Please advise parents mother that RX is done and needs an appt for next month

## 2013-10-30 NOTE — Telephone Encounter (Signed)
Caller name: Becky Berberian  Relation to pt: mother  Call back number: [phone] Pharmacy: [facility] [phone]   Reason for call:   pt requesting a refill of lisdexamfetamine (VYVANSE) 50 MG capsule

## 2013-10-30 NOTE — Telephone Encounter (Signed)
Please advise refill?  Last RX was done on 07-14-13 quantity 30 with 0 refills  Last OV was 08-24-13

## 2013-10-30 NOTE — Telephone Encounter (Signed)
Informed patient of this and he states that he will have to call back to schedule appointment

## 2013-11-09 ENCOUNTER — Emergency Department (HOSPITAL_BASED_OUTPATIENT_CLINIC_OR_DEPARTMENT_OTHER)
Admission: EM | Admit: 2013-11-09 | Discharge: 2013-11-10 | Disposition: A | Discharge status: Home / Self Care | Payer: 59 | Attending: Emergency Medicine | Admitting: Emergency Medicine

## 2013-11-09 ENCOUNTER — Ambulatory Visit (INDEPENDENT_AMBULATORY_CARE_PROVIDER_SITE_OTHER): Payer: 59 | Admitting: Family Medicine

## 2013-11-09 ENCOUNTER — Encounter (HOSPITAL_BASED_OUTPATIENT_CLINIC_OR_DEPARTMENT_OTHER): Payer: Self-pay | Admitting: Emergency Medicine

## 2013-11-09 ENCOUNTER — Encounter: Payer: Self-pay | Admitting: Family Medicine

## 2013-11-09 VITALS — BP 139/83 | HR 82 | Temp 98.3°F | Ht 73.25 in | Wt 278.4 lb

## 2013-11-09 DIAGNOSIS — F411 Generalized anxiety disorder: Secondary | ICD-10-CM

## 2013-11-09 DIAGNOSIS — F329 Major depressive disorder, single episode, unspecified: Secondary | ICD-10-CM | POA: Diagnosis not present

## 2013-11-09 DIAGNOSIS — Z8669 Personal history of other diseases of the nervous system and sense organs: Secondary | ICD-10-CM | POA: Diagnosis not present

## 2013-11-09 DIAGNOSIS — E663 Overweight: Secondary | ICD-10-CM | POA: Insufficient documentation

## 2013-11-09 DIAGNOSIS — K589 Irritable bowel syndrome without diarrhea: Secondary | ICD-10-CM | POA: Insufficient documentation

## 2013-11-09 DIAGNOSIS — Z8719 Personal history of other diseases of the digestive system: Secondary | ICD-10-CM | POA: Diagnosis not present

## 2013-11-09 DIAGNOSIS — K219 Gastro-esophageal reflux disease without esophagitis: Secondary | ICD-10-CM | POA: Insufficient documentation

## 2013-11-09 DIAGNOSIS — Q649 Congenital malformation of urinary system, unspecified: Secondary | ICD-10-CM | POA: Insufficient documentation

## 2013-11-09 DIAGNOSIS — R079 Chest pain, unspecified: Secondary | ICD-10-CM | POA: Insufficient documentation

## 2013-11-09 DIAGNOSIS — Z79899 Other long term (current) drug therapy: Secondary | ICD-10-CM | POA: Insufficient documentation

## 2013-11-09 DIAGNOSIS — R030 Elevated blood-pressure reading, without diagnosis of hypertension: Secondary | ICD-10-CM

## 2013-11-09 DIAGNOSIS — F3289 Other specified depressive episodes: Secondary | ICD-10-CM | POA: Insufficient documentation

## 2013-11-09 DIAGNOSIS — Z87828 Personal history of other (healed) physical injury and trauma: Secondary | ICD-10-CM | POA: Insufficient documentation

## 2013-11-09 DIAGNOSIS — R0789 Other chest pain: Secondary | ICD-10-CM | POA: Diagnosis not present

## 2013-11-09 DIAGNOSIS — Z8619 Personal history of other infectious and parasitic diseases: Secondary | ICD-10-CM | POA: Insufficient documentation

## 2013-11-09 DIAGNOSIS — F909 Attention-deficit hyperactivity disorder, unspecified type: Secondary | ICD-10-CM | POA: Insufficient documentation

## 2013-11-09 DIAGNOSIS — Z872 Personal history of diseases of the skin and subcutaneous tissue: Secondary | ICD-10-CM | POA: Insufficient documentation

## 2013-11-09 DIAGNOSIS — Z23 Encounter for immunization: Secondary | ICD-10-CM

## 2013-11-09 NOTE — Patient Instructions (Signed)
Stop caffeine and minimize sodium   Hypertension Hypertension, commonly called high blood pressure, is when the force of blood pumping through your arteries is too strong. Your arteries are the blood vessels that carry blood from your heart throughout your body. A blood pressure reading consists of a higher number over a lower number, such as 110/72. The higher number (systolic) is the pressure inside your arteries when your heart pumps. The lower number (diastolic) is the pressure inside your arteries when your heart relaxes. Ideally you want your blood pressure below 120/80. Hypertension forces your heart to work harder to pump blood. Your arteries may become narrow or stiff. Having hypertension puts you at risk for heart disease, stroke, and other problems.  RISK FACTORS Some risk factors for high blood pressure are controllable. Others are not.  Risk factors you cannot control include:   Race. You may be at higher risk if you are African American.  Age. Risk increases with age.  Gender. Men are at higher risk than women before age 32 years. After age 21, women are at higher risk than men. Risk factors you can control include:  Not getting enough exercise or physical activity.  Being overweight.  Getting too much fat, sugar, calories, or salt in your diet.  Drinking too much alcohol. SIGNS AND SYMPTOMS Hypertension does not usually cause signs or symptoms. Extremely high blood pressure (hypertensive crisis) may cause headache, anxiety, shortness of breath, and nosebleed. DIAGNOSIS  To check if you have hypertension, your health care provider will measure your blood pressure while you are seated, with your arm held at the level of your heart. It should be measured at least twice using the same arm. Certain conditions can cause a difference in blood pressure between your right and left arms. A blood pressure reading that is higher than normal on one occasion does not mean that you need  treatment. If one blood pressure reading is high, ask your health care provider about having it checked again. TREATMENT  Treating high blood pressure includes making lifestyle changes and possibly taking medicine. Living a healthy lifestyle can help lower high blood pressure. You may need to change some of your habits. Lifestyle changes may include:  Following the DASH diet. This diet is high in fruits, vegetables, and whole grains. It is low in salt, red meat, and added sugars.  Getting at least 2 hours of brisk physical activity every week.  Losing weight if necessary.  Not smoking.  Limiting alcoholic beverages.  Learning ways to reduce stress. If lifestyle changes are not enough to get your blood pressure under control, your health care provider may prescribe medicine. You may need to take more than one. Work closely with your health care provider to understand the risks and benefits. HOME CARE INSTRUCTIONS  Have your blood pressure rechecked as directed by your health care provider.   Take medicines only as directed by your health care provider. Follow the directions carefully. Blood pressure medicines must be taken as prescribed. The medicine does not work as well when you skip doses. Skipping doses also puts you at risk for problems.   Do not smoke.   Monitor your blood pressure at home as directed by your health care provider. SEEK MEDICAL CARE IF:   You think you are having a reaction to medicines taken.  You have recurrent headaches or feel dizzy.  You have swelling in your ankles.  You have trouble with your vision. SEEK IMMEDIATE MEDICAL CARE IF:  You develop a severe headache or confusion.  You have unusual weakness, numbness, or feel faint.  You have severe chest or abdominal pain.  You vomit repeatedly.  You have trouble breathing. MAKE SURE YOU:   Understand these instructions.  Will watch your condition.  Will get help right away if you are  not doing well or get worse. Document Released: 02/02/2005 Document Revised: 06/19/2013 Document Reviewed: 11/25/2012 [facility] Patient Information 2015 [place], [place]. This information is not intended to replace advice given to you by your health care provider. Make sure you discuss any questions you have with your health care provider.

## 2013-11-09 NOTE — Progress Notes (Signed)
Patient ID: Scott Pope, male   DOB: 1990/03/13, 23 y.o.   MRN: [id number] Scott Pope [id number] [birth year] 11/09/2013      Progress Note-Follow Up  Subjective  Chief Complaint  Chief Complaint  Patient presents with  . Hypertension    elevated bp X 3-4 days  . Injections    flu    HPI  Patient is a 23 year old male in today for routine medical care. Patient is in today to discuss episodes of chest pain. He describes it more of a discomfort. He said he did have to work but can also be happening when he sits at home waiting. He often feels lightheaded and short of breath at the same time. No palpitations. No recent illness. Symptoms do not awaken him from sleep. He acknowledges he's been drinking a great deal of red bull and mountain dew. Denies CP/palp/SOB/HA/congestion/fevers/GI or GU c/o. Taking meds as prescribed Past Medical History  Diagnosis Date  . Depression   . Overweight(278.02) 07/07/2010  . Acute low back pain 07/07/2010  . 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 ADHD (attention deficit hyperactivity disorder) 07/07/2010  . 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 GERD 01/14/2009  . ELEVATED BLOOD PRESSURE 01/14/2009  . Insomnia 08/04/2010  . Allergic state 09/04/2010  . Viral gastroenteritis 10/17/2010  . Otitis media of left ear 12/09/2010  . SOB (shortness of breath) on exertion 03/06/2011  . Skin lesion of left leg 04/01/2011  . Cough 04/06/2011  . Knee pain, left 09/24/2011  . Otitis media 11/24/2011  . Tick bite 10/17/2012  . Tinea corporis 11/22/2012  . Preventative health care 11/22/2012  . IBS (irritable bowel syndrome) 08/25/2013    Past Surgical History  Procedure Laterality Date  . Thyroglossal duct cyst      excised at 23 years of age  . Tympanostomy tube placement      multiple   . Circumcision  AT 23YRS OLD    HAD WILD REACTION TO SEDATION MED GIVEN    Family History  Problem Relation Age of Onset  . Obesity Mother   . Depression Mother   .  Fibromyalgia Mother   . Hypertension Mother   . Chronic fatigue Mother   . Hypertension Father   . Fibromyalgia Maternal Grandmother   . Other Maternal Grandmother     reflux  . Other Maternal Grandfather     Lems disease  . Hypertension Paternal Grandfather   . Arthritis Paternal Grandfather   . Colon cancer Neg Hx     History   Social History  . Marital Status: Single    Spouse Name: N/A    Number of Children: N/A  . Years of Education: N/A   Occupational History  . Not on file.   Social History Main Topics  . Smoking status: Never Smoker   . Smokeless tobacco: Never Used  . Alcohol Use: No  . Drug Use: No  . Sexual Activity: No   Other Topics Concern  . Not on file   Social History Narrative  . No narrative on file    Current Outpatient Prescriptions on File Prior to Visit  Medication Sig Dispense Refill  . lisdexamfetamine (VYVANSE) 50 MG capsule Take 1 capsule (50 mg total) by mouth daily. July-August Rx. Can be filled 09/12/13  30 capsule  0  . metoprolol succinate (TOPROL-XL) 25 MG 24 hr tablet Take 1 tablet (25 mg total) by mouth daily.  90 tablet  3  . ranitidine (ZANTAC) 300  MG tablet Take 1 tablet (300 mg total) by mouth at bedtime as needed for heartburn.  30 tablet  2   No current facility-administered medications on file prior to visit.    Allergies  Allergen Reactions  . Strattera [Atomoxetine Hcl]     Suicidal thoughts, depression    Review of Systems  Review of Systems  Constitutional: Negative for fever, chills and malaise/fatigue.  HENT: Negative for congestion, hearing loss and nosebleeds.   Eyes: Negative for discharge.  Respiratory: Negative for cough, sputum production, shortness of breath and wheezing.   Cardiovascular: Negative for chest pain, palpitations and leg swelling.  Gastrointestinal: Negative for heartburn, nausea, vomiting, abdominal pain, diarrhea, constipation and blood in stool.  Genitourinary: Negative for dysuria,  urgency, frequency and hematuria.  Musculoskeletal: Negative for back pain, falls and myalgias.  Skin: Negative for rash.  Neurological: Negative for dizziness, tremors, sensory change, focal weakness, loss of consciousness, weakness and headaches.  Endo/Heme/Allergies: Negative for polydipsia. Does not bruise/bleed easily.  Psychiatric/Behavioral: Negative for depression and suicidal ideas. The patient is not nervous/anxious and does not have insomnia.     Objective  BP 139/83  Pulse 82  Temp(Src) 98.3 F (36.8 C) (Oral)  Ht 6' 1.25" (1.861 m)  Wt 278 lb 6.4 oz (126.281 kg)  BMI 36.46 kg/m2  SpO2 99%  Physical Exam  Physical Exam  Constitutional: He is oriented to person, place, and time and well-developed, well-nourished, and in no distress. No distress.  HENT:  Head: Normocephalic and atraumatic.  Eyes: Conjunctivae are normal.  Neck: Neck supple. No thyromegaly present.  Cardiovascular: Normal rate, regular rhythm and normal heart sounds.   No murmur heard. Pulmonary/Chest: Effort normal and breath sounds normal. No respiratory distress.  Abdominal: He exhibits no distension and no mass. There is no tenderness.  Musculoskeletal: He exhibits no edema.  Neurological: He is alert and oriented to person, place, and time.  Skin: Skin is warm.  Psychiatric: Memory, affect and judgment normal.      Assessment & Plan   Overweight Encouraged DASH diet, decrease po intake and increase exercise as tolerated. Needs 7-8 hours of sleep nightly. Avoid trans fats, eat small, frequent meals every 4-5 hours with lean proteins, complex carbs and healthy fats. Minimize simple carbs, GMO foods.  ANXIETY STATE, UNSPECIFIED Discussed anxiety at length, declines meds for now  Atypical chest pain C/w anxiety encouraged to consider meds or counseling. ekg normal, minimize caffeine  GERD Avoid offending foods, start probiotics. Do not eat large meals in late evening and consider raising  head of bed.   ELEVATED BLOOD PRESSURE Mild, Well controlled, no changes to meds. Encouraged heart healthy diet such as the DASH diet and exercise as tolerated.

## 2013-11-09 NOTE — Progress Notes (Signed)
Pre visit review using our clinic review tool, if applicable. No additional management support is needed unless otherwise documented below in the visit note. 

## 2013-11-09 NOTE — ED Notes (Addendum)
Chest pain off and on for over a week. The past couple of days he has felt like someone is sitting on his chest. He has a cold and has been using Mucinex DM. He was seen by Dr Rogelia Rohrer today and had a normal exam.

## 2013-11-09 NOTE — ED Provider Notes (Signed)
CSN: [id number]     Arrival date & time 11/09/13  2203 History   First MD Initiated Contact with Patient 11/09/13 2355     This chart was scribed for Hanley Seamen, MD by Arlan Organ, ED Scribe. This patient was seen in room MH01/MH01 and the patient's care was started 11:58 PM.   Chief Complaint  Patient presents with  . Chest Pain   HPI  HPI Comments: Scott Pope is a 23 y.o. male with a PMHx of ADHD, and GERD  who presents to the Emergency Department complaining of constant, moderate chest pressure x 1 week that is unchanged. Pt characterizes discomfort as "it feels like someone is sitting on my chest". Discomfort is not affected by day, time or activity. Scott Pope also reports intermittent, sharp, substernal chest pain onset 2 days lasting for approximately 1 minute when episodes come. Pain is exacerbated with deep breathing. He denies any cough, fever, nausea, vomiting, or leg swelling. No recent long distance travel. Pt is currently on Protonix for acid reflux daily.  Past Medical History  Diagnosis Date  . Depression   . Overweight(278.02) 07/07/2010  . Acute low back pain 07/07/2010  . 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 ADHD (attention deficit hyperactivity disorder) 07/07/2010  . 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 GERD 01/14/2009  . ELEVATED BLOOD PRESSURE 01/14/2009  . Insomnia 08/04/2010  . Allergic state 09/04/2010  . Viral gastroenteritis 10/17/2010  . Otitis media of left ear 12/09/2010  . SOB (shortness of breath) on exertion 03/06/2011  . Skin lesion of left leg 04/01/2011  . Cough 04/06/2011  . Knee pain, left 09/24/2011  . Otitis media 11/24/2011  . Tick bite 10/17/2012  . Tinea corporis 11/22/2012  . Preventative health care 11/22/2012  . IBS (irritable bowel syndrome) 08/25/2013   Past Surgical History  Procedure Laterality Date  . Thyroglossal duct cyst      excised at 23 years of age  . Tympanostomy tube placement      multiple   . Circumcision  AT 24YRS OLD     HAD WILD REACTION TO SEDATION MED GIVEN   Family History  Problem Relation Age of Onset  . Obesity Mother   . Depression Mother   . Fibromyalgia Mother   . Hypertension Mother   . Chronic fatigue Mother   . Hypertension Father   . Fibromyalgia Maternal Grandmother   . Other Maternal Grandmother     reflux  . Other Maternal Grandfather     Lems disease  . Hypertension Paternal Grandfather   . Arthritis Paternal Grandfather   . Colon cancer Neg Hx    History  Substance Use Topics  . Smoking status: Never Smoker   . Smokeless tobacco: Never Used  . Alcohol Use: No    Review of Systems  All other systems reviewed and are negative.   Allergies  Strattera  Home Medications   Prior to Admission medications   Medication Sig Start Date End Date Taking? Authorizing Provider  lisdexamfetamine (VYVANSE) 50 MG capsule Take 1 capsule (50 mg total) by mouth daily. July-August Rx. Can be filled 09/12/13 10/30/13   Bradd Canary, MD  metoprolol succinate (TOPROL-XL) 25 MG 24 hr tablet Take 1 tablet (25 mg total) by mouth daily. 01/23/13   Waldon Merl, PA-C  ranitidine (ZANTAC) 300 MG tablet Take 1 tablet (300 mg total) by mouth at bedtime as needed for heartburn. 10/14/12   Bradd Canary, MD   Triage Vitals:  BP 145/87  Pulse 65  Temp(Src) 98.5 F (36.9 C) (Oral)  Resp 20  Ht  (1.905 m)  Wt 278 lb (126.1 kg)  BMI 34.75 kg/m2  SpO2 99%   Physical Exam  General: Well-developed, well-nourished male in no acute distress; appearance consistent with age of record HENT: normocephalic; atraumatic Eyes: pupils equal, round and reactive to light; extraocular muscles intact Neck: supple Heart: regular rate and rhythm; no murmurs, rubs or gallops Lungs: clear to auscultation bilaterally Abdomen: soft; nondistended; nontender; no masses or hepatosplenomegaly; bowel sounds present Extremities: No deformity; full range of motion; pulses normal Neurologic: Awake, alert and  oriented; motor function intact in all extremities and symmetric; no facial droop Skin: Warm and dry Psychiatric: Normal mood and affect   ED Course  Procedures (including critical care time)  DIAGNOSTIC STUDIES: Oxygen Saturation is 99% on RA, Normal by my interpretation.    COORDINATION OF CARE:   EKG Interpretation   Date/Time:  Thursday November 09 2013 22:10:58 EDT Ventricular Rate:  68 PR Interval:  148 QRS Duration: 84 QT Interval:  378 QTC Calculation: 401 R Axis:   73 Text Interpretation:  Normal sinus rhythm Normal ECG No old tracing to  compare Confirmed by Ethelda Chick  MD, SAM [phone]) on 11/09/2013 10:16:29 PM      MDM  Nursing notes and vitals signs, including pulse oximetry, reviewed.  Summary of this visit's results, reviewed by myself:  Labs:  No results found for this or any previous visit (from the past 24 hour(s)).  Imaging Studies: Dg Chest 2 View  11/10/2013   CLINICAL DATA:  Shortness of breath, mid chest pain radiating to right lower flank for 1 week, smoking history  EXAM: CHEST  2 VIEW  COMPARISON:  None.  FINDINGS: The heart size and mediastinal contours are within normal limits. Both lungs are clear. The visualized skeletal structures are unremarkable.  IMPRESSION: No active cardiopulmonary disease.   Electronically Signed   By: Esperanza Heir M.D.   On: 11/10/2013 00:50   1:08 AM Patient has improved air movement after albuterol and Atrovent treatment. He states his chest feels somewhat tighter but the pain has abated.  I personally performed the services described in this documentation, which was scribed in my presence. The recorded information has been reviewed and is accurate.    Hanley Seamen, MD 11/10/13 [phone]

## 2013-11-10 ENCOUNTER — Ambulatory Visit: Payer: 59 | Admitting: Physician Assistant

## 2013-11-10 ENCOUNTER — Emergency Department (HOSPITAL_BASED_OUTPATIENT_CLINIC_OR_DEPARTMENT_OTHER): Payer: 59

## 2013-11-10 ENCOUNTER — Encounter: Payer: Self-pay | Admitting: Physician Assistant

## 2013-11-10 ENCOUNTER — Ambulatory Visit (INDEPENDENT_AMBULATORY_CARE_PROVIDER_SITE_OTHER): Payer: 59 | Admitting: Physician Assistant

## 2013-11-10 VITALS — BP 130/78 | HR 84 | Temp 98.2°F | Resp 16 | Ht 73.25 in | Wt 278.4 lb

## 2013-11-10 DIAGNOSIS — R0789 Other chest pain: Secondary | ICD-10-CM

## 2013-11-10 MED ORDER — ALBUTEROL SULFATE HFA 108 (90 BASE) MCG/ACT IN AERS
2.0000 | INHALATION_SPRAY | RESPIRATORY_TRACT | Status: DC | PRN
Start: 2013-11-10 — End: 2013-11-10
  Administered 2013-11-10: 2 via RESPIRATORY_TRACT

## 2013-11-10 MED ORDER — ALBUTEROL SULFATE HFA 108 (90 BASE) MCG/ACT IN AERS: 2.0000 | INHALATION_SPRAY | RESPIRATORY_TRACT | Status: DC

## 2013-11-10 MED ORDER — ALBUTEROL SULFATE HFA 108 (90 BASE) MCG/ACT IN AERS
INHALATION_SPRAY | RESPIRATORY_TRACT | Status: AC
  Administered 2013-11-10: 2 via RESPIRATORY_TRACT
  Filled 2013-11-10: qty 6.7

## 2013-11-10 MED ORDER — IPRATROPIUM-ALBUTEROL 0.5-2.5 (3) MG/3ML IN SOLN
3.0000 mL | RESPIRATORY_TRACT | Status: DC
  Administered 2013-11-10: 3 mL via RESPIRATORY_TRACT
  Filled 2013-11-10: qty 3

## 2013-11-10 NOTE — Patient Instructions (Addendum)
Please continue medications as prescribed. I think there is the potential of some mild allergy-induced asthma (airway reactivity).  Keep using the inhaler as directed.  I also think mild anxiety could be the cause.  Please reconsider treatment for this. Follow-up with Dr. Abner Greenspan as directed.  If symptoms recur and are severe, please return to the ER.

## 2013-11-10 NOTE — Progress Notes (Signed)
Pre visit review using our clinic review tool, if applicable. No additional management support is needed unless otherwise documented below in the visit note/SLS  

## 2013-11-10 NOTE — Discharge Instructions (Signed)

## 2013-11-10 NOTE — Patient Instructions (Signed)
Instructed patient on the proper use of administering albuterol mdi via aerochamber

## 2013-11-12 ENCOUNTER — Encounter: Payer: Self-pay | Admitting: Family Medicine

## 2013-11-12 DIAGNOSIS — R0789 Other chest pain: Secondary | ICD-10-CM | POA: Insufficient documentation

## 2013-11-12 NOTE — Assessment & Plan Note (Signed)
ER workup unremarkable.  Patient with history of GERD that is well-controlled.  Also with history of exercise-induced asthma and anxiety which could both be causative factors.  Highly doubt cardiac etiology.  Giving improvement in symptoms with nebulizer, suspect chest tightness from allergic asthma, although anxiety would certainly worsen symptoms.Encouraged patient to use Albuterol inhaler as directed. Also encouraged him to reconsider anxiety as potential cause. Follow-up with PCP in 3-4 weeks.  Returnn sooner if needed.  Alarm signs/symptoms discussed with patient.

## 2013-11-12 NOTE — Assessment & Plan Note (Signed)
Avoid offending foods, start probiotics. Do not eat large meals in late evening and consider raising head of bed.  

## 2013-11-12 NOTE — Progress Notes (Signed)
Patient presents to clinic today for ER follow-up of atypical chest pain.  Patient was seen in ER earlier this morning with complaints of sudden onset sharp chest pain and chest tightness.  Proceeded to ER where EKG and CXR were performed, both within normal limits.  Patient was given a in-house nebulizer treatment due to chest tightness and noted resolution of symptoms.    Past Medical History  Diagnosis Date  . Depression   . Overweight(278.02) 07/07/2010  . Acute low back pain 07/07/2010  . 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 ADHD (attention deficit hyperactivity disorder) 07/07/2010  . 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 GERD 01/14/2009  . ELEVATED BLOOD PRESSURE 01/14/2009  . Insomnia 08/04/2010  . Allergic state 09/04/2010  . Viral gastroenteritis 10/17/2010  . Otitis media of left ear 12/09/2010  . SOB (shortness of breath) on exertion 03/06/2011  . Skin lesion of left leg 04/01/2011  . Cough 04/06/2011  . Knee pain, left 09/24/2011  . Otitis media 11/24/2011  . Tick bite 10/17/2012  . Tinea corporis 11/22/2012  . Preventative health care 11/22/2012  . IBS (irritable bowel syndrome) 08/25/2013    Current Outpatient Prescriptions on File Prior to Visit  Medication Sig Dispense Refill  . lisdexamfetamine (VYVANSE) 50 MG capsule Take 1 capsule (50 mg total) by mouth daily. July-August Rx. Can be filled 09/12/13  30 capsule  0  . metoprolol succinate (TOPROL-XL) 25 MG 24 hr tablet Take 1 tablet (25 mg total) by mouth daily.  90 tablet  3  . ranitidine (ZANTAC) 300 MG tablet Take 1 tablet (300 mg total) by mouth at bedtime as needed for heartburn.  30 tablet  2   No current facility-administered medications on file prior to visit.    Allergies  Allergen Reactions  . Strattera [Atomoxetine Hcl]     Suicidal thoughts, depression    Family History  Problem Relation Age of Onset  . Obesity Mother   . Depression Mother   . Fibromyalgia Mother   . Hypertension Mother   . Chronic  fatigue Mother   . Hypertension Father   . Fibromyalgia Maternal Grandmother   . Other Maternal Grandmother     reflux  . Other Maternal Grandfather     Lems disease  . Hypertension Paternal Grandfather   . Arthritis Paternal Grandfather   . Colon cancer Neg Hx     History   Social History  . Marital Status: Single    Spouse Name: N/A    Number of Children: N/A  . Years of Education: N/A   Social History Main Topics  . Smoking status: Never Smoker   . Smokeless tobacco: Never Used  . Alcohol Use: No  . Drug Use: No  . Sexual Activity: No   Other Topics Concern  . None   Social History Narrative  . None   Review of Systems - See HPI.  All other ROS are negative.  BP 130/78  Pulse 84  Temp(Src) 98.2 F (36.8 C) (Oral)  Resp 16  Ht 6' 1.25" (1.861 m)  Wt 278 lb 6 oz (126.27 kg)  BMI 36.46 kg/m2  SpO2 98%  Physical Exam  Vitals reviewed. Constitutional: He is oriented to person, place, and time and well-developed, well-nourished, and in no distress.  HENT:  Head: Normocephalic and atraumatic.  Right Ear: External ear normal.  Left Ear: External ear normal.  Nose: Nose normal.  Mouth/Throat: Oropharynx is clear and moist. No oropharyngeal exudate.  Eyes: Conjunctivae are normal.  Pupils are equal, round, and reactive to light.  Neck: Neck supple. No thyromegaly present.  Cardiovascular: Normal rate, regular rhythm, normal heart sounds and intact distal pulses.   Pulmonary/Chest: Effort normal and breath sounds normal. No respiratory distress. He has no wheezes. He has no rales. He exhibits no tenderness.  Lymphadenopathy:    He has no cervical adenopathy.  Neurological: He is alert and oriented to person, place, and time.  Skin: Skin is warm and dry. No rash noted.  Psychiatric: Affect normal.   Assessment/Plan: Atypical chest pain ER workup unremarkable.  Patient with history of GERD that is well-controlled.  Also with history of exercise-induced asthma  and anxiety which could both be causative factors.  Highly doubt cardiac etiology.  Giving improvement in symptoms with nebulizer, suspect chest tightness from allergic asthma, although anxiety would certainly worsen symptoms.Encouraged patient to use Albuterol inhaler as directed. Also encouraged him to reconsider anxiety as potential cause. Follow-up with PCP in 3-4 weeks.  Returnn sooner if needed.  Alarm signs/symptoms discussed with patient.

## 2013-11-12 NOTE — Assessment & Plan Note (Signed)
Encouraged DASH diet, decrease po intake and increase exercise as tolerated. Needs 7-8 hours of sleep nightly. Avoid trans fats, eat small, frequent meals every 4-5 hours with lean proteins, complex carbs and healthy fats. Minimize simple carbs, GMO foods. 

## 2013-11-12 NOTE — Assessment & Plan Note (Addendum)
C/w anxiety encouraged to consider meds or counseling. ekg normal, minimize caffeine

## 2013-11-12 NOTE — Assessment & Plan Note (Signed)
Mild, Well controlled, no changes to meds. Encouraged heart healthy diet such as the DASH diet and exercise as tolerated.

## 2013-11-12 NOTE — Assessment & Plan Note (Signed)
Discussed anxiety at length, declines meds for now

## 2013-11-29 ENCOUNTER — Other Ambulatory Visit: Payer: Self-pay | Admitting: Physician Assistant

## 2013-12-08 ENCOUNTER — Telehealth: Payer: Self-pay | Admitting: Family Medicine

## 2013-12-08 MED ORDER — ALBUTEROL SULFATE HFA 108 (90 BASE) MCG/ACT IN AERS: 2.0000 | INHALATION_SPRAY | Freq: Four times a day (QID) | RESPIRATORY_TRACT | Status: DC | PRN

## 2013-12-08 NOTE — Telephone Encounter (Signed)
OK to refill Albuterol HFA, 2 puffs po q 6 hours prn sob, wheeze, disp #1 with 3 rf

## 2013-12-08 NOTE — Telephone Encounter (Signed)
Please advise 

## 2013-12-08 NOTE — Telephone Encounter (Signed)
Caller name:kAREN  Relation to pt:MOTHER  Call back number:[phone] Pharmacy:[facility]   Reason for call: NEEDS REFILL ON VENTOLIN HFA GIVEN TO HIM IN THE EMERGENCY ROOM

## 2013-12-18 ENCOUNTER — Telehealth: Payer: Self-pay | Admitting: Family Medicine

## 2013-12-18 MED ORDER — LISDEXAMFETAMINE DIMESYLATE 50 MG PO CAPS: 50.0000 mg | ORAL_CAPSULE | Freq: Every day | ORAL | Status: DC

## 2013-12-18 NOTE — Telephone Encounter (Signed)
Left a message for pts mom stating rx's are ready and to bring a photo id to sign out medication

## 2013-12-18 NOTE — Telephone Encounter (Signed)
Caller name:karen Mounsey Relation to ZO:XWRUEApt:mother Call back number:[phone] Pharmacy:  Reason for call: pt needs new rx vyvanse 50 mg, please call when available for pick.

## 2013-12-18 NOTE — Addendum Note (Signed)
Addended by: Court JoyFREEMAN, Skarlet Lyons L on: 12/18/2013 11:30 AM   Modules accepted: Orders

## 2013-12-21 ENCOUNTER — Ambulatory Visit: Payer: 59 | Admitting: Family Medicine

## 2013-12-31 ENCOUNTER — Other Ambulatory Visit: Payer: Self-pay | Admitting: Internal Medicine

## 2014-01-09 ENCOUNTER — Ambulatory Visit: Payer: 59 | Admitting: Family Medicine

## 2014-01-15 ENCOUNTER — Ambulatory Visit (INDEPENDENT_AMBULATORY_CARE_PROVIDER_SITE_OTHER): Payer: 59 | Admitting: Family

## 2014-01-15 ENCOUNTER — Encounter: Payer: Self-pay | Admitting: Family

## 2014-01-15 VITALS — BP 126/70 | HR 56 | Temp 98.0°F | Resp 16 | Ht 73.25 in | Wt 278.6 lb

## 2014-01-15 DIAGNOSIS — R0789 Other chest pain: Secondary | ICD-10-CM

## 2014-01-15 NOTE — Progress Notes (Signed)
Pre visit review using our clinic review tool, if applicable. No additional management support is needed unless otherwise documented below in the visit note. 

## 2014-01-15 NOTE — Assessment & Plan Note (Addendum)
Stable.  Reproducible.  Suspect musculoskeletal. Rx with short course or NSAIDS. Records requested from ED.  BP looks good today- monitor.  Add zyrtec for dizziness- I suspect some inner ear fluid as TM's dull on exam.

## 2014-01-15 NOTE — Progress Notes (Signed)
Subjective:    Patient ID: Scott Pope, male    DOB: 05/21/1990, 23 y.o.   MRN: [id number]  HPI  Pt reports that [facility] Friday night. Due to CP/SOB and dizziness.had brief episode of blurred vision.  Chest pain was located in the mid lower chest.   He reports that they performed chest x-ray and EKG and was told that his BP is normal.  Reports that his BP was 184/84 when he presented to the ED. He reports + med compliance. He is maintained on vyvanse.   He is on protonix.  Denies recent GERD symptoms.   BP Readings from Last 3 Encounters:  01/15/14 126/70  11/10/13 130/78  11/10/13 135/80     Review of Systems See HPI  Past Medical History  Diagnosis Date  . Depression   . Overweight(278.02) 07/07/2010  . Acute low back pain 07/07/2010  . 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 ADHD (attention deficit hyperactivity disorder) 07/07/2010  . 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 GERD 01/14/2009  . ELEVATED BLOOD PRESSURE 01/14/2009  . Insomnia 08/04/2010  . Allergic state 09/04/2010  . Viral gastroenteritis 10/17/2010  . Otitis media of left ear 12/09/2010  . SOB (shortness of breath) on exertion 03/06/2011  . Skin lesion of left leg 04/01/2011  . Cough 04/06/2011  . Knee pain, left 09/24/2011  . Otitis media 11/24/2011  . Tick bite 10/17/2012  . Tinea corporis 11/22/2012  . Preventative health care 11/22/2012  . IBS (irritable bowel syndrome) 08/25/2013    History   Social History  . Marital Status: Single    Spouse Name: N/A    Number of Children: N/A  . Years of Education: N/A   Occupational History  . Not on file.   Social History Main Topics  . Smoking status: Never Smoker   . Smokeless tobacco: Never Used  . Alcohol Use: No  . Drug Use: No  . Sexual Activity: No   Other Topics Concern  . Not on file   Social History Narrative    Past Surgical History  Procedure Laterality Date  . Thyroglossal duct cyst      excised at 23 years  of age  . Tympanostomy tube placement      multiple   . Circumcision  AT 23YRS OLD    HAD WILD REACTION TO SEDATION MED GIVEN    Family History  Problem Relation Age of Onset  . Obesity Mother   . Depression Mother   . Fibromyalgia Mother   . Hypertension Mother   . Chronic fatigue Mother   . Hypertension Father   . Fibromyalgia Maternal Grandmother   . Other Maternal Grandmother     reflux  . Other Maternal Grandfather     Lems disease  . Hypertension Paternal Grandfather   . Arthritis Paternal Grandfather   . Colon cancer Neg Hx     Allergies  Allergen Reactions  . Strattera [Atomoxetine Hcl]     Suicidal thoughts, depression    Current Outpatient Prescriptions on File Prior to Visit  Medication Sig Dispense Refill  . albuterol (PROVENTIL HFA;VENTOLIN HFA) 108 (90 BASE) MCG/ACT inhaler Inhale 2 puffs into the lungs every 6 (six) hours as needed for wheezing or shortness of breath. 1 Inhaler 3  . lisdexamfetamine (VYVANSE) 50 MG capsule Take 1 capsule (50 mg total) by mouth daily. Nov 2, 15 RX 30 capsule 0  . lisdexamfetamine (VYVANSE) 50 MG capsule Take 1 capsule (  50 mg total) by mouth daily. RX 01-17-14 30 capsule 0  . lisdexamfetamine (VYVANSE) 50 MG capsule Take 1 capsule (50 mg total) by mouth daily. RX for 02-17-14 30 capsule 0  . metoprolol succinate (TOPROL-XL) 25 MG 24 hr tablet TAKE 1 TABLET (25 MG TOTAL) BY MOUTH DAILY. 90 tablet 2   No current facility-administered medications on file prior to visit.    BP 126/70 mmHg  Pulse 56  Temp(Src) 98 F (36.7 C) (Oral)  Resp 16  Ht 6' 1.25" (1.861 m)  Wt 278 lb 9.6 oz (126.372 kg)  BMI 36.49 kg/m2  SpO2 97%       Objective:   Physical Exam  Constitutional: He is oriented to person, place, and time. He appears well-developed and well-nourished. No distress.  HENT:  Head: Normocephalic and atraumatic.  Bilateral TMs are dull, no erythema   Cardiovascular: Normal rate and regular rhythm.   No murmur  heard. Pulmonary/Chest: Effort normal and breath sounds normal. No respiratory distress. He has no wheezes. He has no rales. He exhibits no tenderness.  Abdominal:  Mild left lower abdominal tenderness to palpation without guarding.  Neg murphys Neg epigastric pain  Musculoskeletal:  + tenderness to palpation overlying sternum   Neurological: He is alert and oriented to person, place, and time.  Skin: Skin is warm and dry.  Psychiatric: He has a normal mood and affect. His behavior is normal. Judgment and thought content normal.          Assessment & Plan:

## 2014-01-15 NOTE — Patient Instructions (Signed)
Add zytec 10mg  once daily. Add aleve 220mg  twice daily for 3 days for chest tenderness.  Continue protonix. Call if symptoms worsen or if not improved in 1 week. Please go to the ED if severe chest pain or severe shortness of breath. Follow up in 6 weeks for Blood pressure recheck.

## 2014-01-16 ENCOUNTER — Ambulatory Visit: Payer: 59 | Admitting: Family Medicine

## 2014-01-30 ENCOUNTER — Other Ambulatory Visit: Payer: Self-pay | Admitting: Internal Medicine

## 2014-02-05 ENCOUNTER — Other Ambulatory Visit: Payer: Self-pay | Admitting: Internal Medicine

## 2014-02-05 ENCOUNTER — Telehealth: Payer: Self-pay

## 2014-02-05 MED ORDER — PANTOPRAZOLE SODIUM 40 MG PO TBEC: 40.0000 mg | DELAYED_RELEASE_TABLET | Freq: Every day | ORAL | Status: DC

## 2014-02-05 NOTE — Telephone Encounter (Signed)
Claudean KindsKaren Mom [phone] [facility]  [facility]av[facility] needs a refill on his pantoprazole (PROTONIX) 40 MG tablet

## 2014-02-05 NOTE — Telephone Encounter (Signed)
Caller name: Joycie PeekHolt, Damein T Relation to pt: self  Call back number: [phone] [facility] [phone]  Reason for call:  Pt  checking on the status protonix

## 2014-02-05 NOTE — Telephone Encounter (Signed)
[facility] Rx [facility]. No vm on mom's phone.

## 2014-02-07 ENCOUNTER — Other Ambulatory Visit: Payer: Self-pay | Admitting: Family Medicine

## 2014-02-07 NOTE — Telephone Encounter (Signed)
PA initiated through [employer]. Awaiting determination. JG//CMA

## 2014-02-07 NOTE — Telephone Encounter (Signed)
Caller name:Cygan Clydie BraunKaren Relation to ZO:XWRUEApt:mother Call back number:[phone] Pharmacy:  Reason for call: pt is needing prior authorization for rx lisdexamfetamine (VYVANSE) 50 , states the insurance company wants to know why the pt is on it at the age of 23 and if any other meds have been tried.

## 2014-02-12 NOTE — Telephone Encounter (Signed)
[facility]ia [facility] is stating that they did not receive the PA and would like for you to resent to  [phone]

## 2014-02-12 NOTE — Telephone Encounter (Signed)
Patient mom states that this needs to be done as soon as possible. She states that we could resend copy of letter from the 12/23.

## 2014-02-13 ENCOUNTER — Telehealth: Payer: Self-pay | Admitting: Family Medicine

## 2014-02-13 NOTE — Telephone Encounter (Signed)
Attempted to call patient back again. If patient calls back, please inform them medication has been approved.  Thanks.

## 2014-02-13 NOTE — Telephone Encounter (Signed)
Insurance company calling back to follow up states pt is completely out of meds trying to get an emergency supply, need to call [phone] directly to speak with a live person to have the pa done immediately.

## 2014-02-13 NOTE — Telephone Encounter (Signed)
Caller name: Boyce MediciKaren Lepore  Relation to pt: mother  Call back number: [phone]   Reason for call:  Mother states insurance denied request and insurance company advised mother to call office to find out the reason why. Please advise

## 2014-02-13 NOTE — Telephone Encounter (Signed)
error:[id number] ° °

## 2014-02-13 NOTE — Telephone Encounter (Signed)
[employer] insurance company using number listed below.  Medication approved from today through 02/13/15.  Reference #:  [id number].  Attempted to call patient.  No answer.  Unable to leave message, no mailbox set up.

## 2014-02-14 ENCOUNTER — Telehealth: Payer: Self-pay | Admitting: *Deleted

## 2014-02-14 NOTE — Telephone Encounter (Signed)
Prior authorization for Vyvanse approved effective through 02/13/2015. JG//CMA

## 2014-04-01 ENCOUNTER — Emergency Department (HOSPITAL_BASED_OUTPATIENT_CLINIC_OR_DEPARTMENT_OTHER)
Admission: EM | Admit: 2014-04-01 | Discharge: 2014-04-01 | Disposition: A | Discharge status: Home / Self Care | Payer: 59 | Attending: Emergency Medicine | Admitting: Emergency Medicine

## 2014-04-01 ENCOUNTER — Encounter (HOSPITAL_BASED_OUTPATIENT_CLINIC_OR_DEPARTMENT_OTHER): Payer: Self-pay

## 2014-04-01 ENCOUNTER — Emergency Department (HOSPITAL_BASED_OUTPATIENT_CLINIC_OR_DEPARTMENT_OTHER): Payer: 59

## 2014-04-01 DIAGNOSIS — Z8619 Personal history of other infectious and parasitic diseases: Secondary | ICD-10-CM | POA: Diagnosis not present

## 2014-04-01 DIAGNOSIS — Z79899 Other long term (current) drug therapy: Secondary | ICD-10-CM | POA: Insufficient documentation

## 2014-04-01 DIAGNOSIS — Z8669 Personal history of other diseases of the nervous system and sense organs: Secondary | ICD-10-CM | POA: Diagnosis not present

## 2014-04-01 DIAGNOSIS — S99922A Unspecified injury of left foot, initial encounter: Secondary | ICD-10-CM | POA: Diagnosis present

## 2014-04-01 DIAGNOSIS — F329 Major depressive disorder, single episode, unspecified: Secondary | ICD-10-CM | POA: Insufficient documentation

## 2014-04-01 DIAGNOSIS — Y9289 Other specified places as the place of occurrence of the external cause: Secondary | ICD-10-CM | POA: Insufficient documentation

## 2014-04-01 DIAGNOSIS — Y998 Other external cause status: Secondary | ICD-10-CM | POA: Diagnosis not present

## 2014-04-01 DIAGNOSIS — E663 Overweight: Secondary | ICD-10-CM | POA: Insufficient documentation

## 2014-04-01 DIAGNOSIS — Z87718 Personal history of other specified (corrected) congenital malformations of genitourinary system: Secondary | ICD-10-CM | POA: Insufficient documentation

## 2014-04-01 DIAGNOSIS — S93402A Sprain of unspecified ligament of left ankle, initial encounter: Secondary | ICD-10-CM | POA: Diagnosis not present

## 2014-04-01 DIAGNOSIS — Y9389 Activity, other specified: Secondary | ICD-10-CM | POA: Insufficient documentation

## 2014-04-01 DIAGNOSIS — F909 Attention-deficit hyperactivity disorder, unspecified type: Secondary | ICD-10-CM | POA: Insufficient documentation

## 2014-04-01 DIAGNOSIS — Z872 Personal history of diseases of the skin and subcutaneous tissue: Secondary | ICD-10-CM | POA: Diagnosis not present

## 2014-04-01 DIAGNOSIS — S93602A Unspecified sprain of left foot, initial encounter: Secondary | ICD-10-CM | POA: Diagnosis not present

## 2014-04-01 DIAGNOSIS — K219 Gastro-esophageal reflux disease without esophagitis: Secondary | ICD-10-CM | POA: Diagnosis not present

## 2014-04-01 DIAGNOSIS — W208XXA Other cause of strike by thrown, projected or falling object, initial encounter: Secondary | ICD-10-CM | POA: Insufficient documentation

## 2014-04-01 MED ORDER — TETANUS-DIPHTH-ACELL PERTUSSIS 5-2.5-18.5 LF-MCG/0.5 IM SUSP: 0.5000 mL | Freq: Once | INTRAMUSCULAR | Status: DC

## 2014-04-01 NOTE — ED Provider Notes (Signed)
CSN: [id number]     Arrival date & time 04/01/14  0750 History   First MD Initiated Contact with Patient 04/01/14 0800     Chief Complaint  Patient presents with  . Foot Injury     Patient is a 24 y.o. male presenting with foot injury. The history is provided by the patient.  Foot Injury Location:  Ankle and foot Time since incident:  30 minutes Injury: yes   Mechanism of injury: crush   Crush injury:    Mechanism: trailer hitch.   Duration of crushing force:  1 minute Ankle location:  L ankle Foot location:  L foot Pain details:    Quality:  Aching   Radiates to:  Does not radiate   Severity:  Moderate   Onset quality:  Sudden   Timing:  Constant   Progression:  Worsening Chronicity:  New Tetanus status:  Up to date Relieved by:  Rest Worsened by:  Activity and bearing weight Associated symptoms: no back pain and no muscle weakness   Associated symptoms comment:  "tingling"  Patient reports trailer hitch fell on left foot/ankle about 30 minutes ago He was able to remove the hitch almost immediately He reports pain/tingling in left foot    Past Medical History  Diagnosis Date  . Depression   . Overweight(278.02) 07/07/2010  . Acute low back pain 07/07/2010  . 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 ADHD (attention deficit hyperactivity disorder) 07/07/2010  . 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 GERD 01/14/2009  . ELEVATED BLOOD PRESSURE 01/14/2009  . Insomnia 08/04/2010  . Allergic state 09/04/2010  . Viral gastroenteritis 10/17/2010  . Otitis media of left ear 12/09/2010  . SOB (shortness of breath) on exertion 03/06/2011  . Skin lesion of left leg 04/01/2011  . Cough 04/06/2011  . Knee pain, left 09/24/2011  . Otitis media 11/24/2011  . Tick bite 10/17/2012  . Tinea corporis 11/22/2012  . Preventative health care 11/22/2012  . IBS (irritable bowel syndrome) 08/25/2013   Past Surgical History  Procedure Laterality Date  . Thyroglossal duct cyst      excised at 24  years of age  . Tympanostomy tube placement      multiple   . Circumcision  AT 24YRS OLD    HAD WILD REACTION TO SEDATION MED GIVEN   Family History  Problem Relation Age of Onset  . Obesity Mother   . Depression Mother   . Fibromyalgia Mother   . Hypertension Mother   . Chronic fatigue Mother   . Hypertension Father   . Fibromyalgia Maternal Grandmother   . Other Maternal Grandmother     reflux  . Other Maternal Grandfather     Lems disease  . Hypertension Paternal Grandfather   . Arthritis Paternal Grandfather   . Colon cancer Neg Hx    History  Substance Use Topics  . Smoking status: Never Smoker   . Smokeless tobacco: Never Used  . Alcohol Use: No    Review of Systems  Musculoskeletal: Positive for arthralgias. Negative for back pain.  Neurological:       "tingling"       Allergies  Strattera  Home Medications   Prior to Admission medications   Medication Sig Start Date End Date Taking? Authorizing Provider  albuterol (PROVENTIL HFA;VENTOLIN HFA) 108 (90 BASE) MCG/ACT inhaler Inhale 2 puffs into the lungs every 6 (six) hours as needed for wheezing or shortness of breath. 12/08/13   Bradd CanaryStacey A Blyth, MD  lisdexamfetamine Arlyce Harman(VYVANSE)  50 MG capsule Take 1 capsule (50 mg total) by mouth daily. Nov 2, 15 RX 12/18/13   Bradd Canary, MD  lisdexamfetamine (VYVANSE) 50 MG capsule Take 1 capsule (50 mg total) by mouth daily. RX 01-17-14 12/18/13   Bradd Canary, MD  lisdexamfetamine (VYVANSE) 50 MG capsule Take 1 capsule (50 mg total) by mouth daily. RX for 02-17-14 12/18/13   Bradd Canary, MD  metoprolol succinate (TOPROL-XL) 25 MG 24 hr tablet TAKE 1 TABLET (25 MG TOTAL) BY MOUTH DAILY. 11/29/13   Waldon Merl, PA-C  pantoprazole (PROTONIX) 40 MG tablet Take 1 tablet (40 mg total) by mouth daily. 02/05/14   Bradd Canary, MD   BP 138/99 mmHg  Pulse 90  Temp(Src) 97.9 F (36.6 C) (Oral)  Resp 18  Wt 275 lb (124.739 kg)  SpO2 98% Physical Exam CONSTITUTIONAL:  Well developed/well nourished HEAD: Normocephalic/atraumatic NECK: supple no meningeal signs SPINE/BACK:entire spine nontender CV: S1/S2 noted, no murmurs/rubs/gallops noted LUNGS: Lungs are clear to auscultation bilaterally, no apparent distress NEURO: Pt is awake/alert/appropriate, moves all extremitiesx4.  No facial droop.   EXTREMITIES: pulse noted in left foot. Tenderness to dorsal aspect of left foot as well as bilateral left malleoli.  He can flex/extend at left ankle.  He can flex/extends toes on left foot but limited due to pain.  Distal cap refill less than 3 seconds.  The left foot is warm to touch. There is no tenderness to left proximal tibial surface and no tenderness to left knee/prox fib SKIN: warm, color normal. Abrasion noted to left foot.  No bruising noted PSYCH: no abnormalities of mood noted, alert and oriented to situation  ED Course  Procedures  8:21 AM Pt declines pain meds at this time 9:09 AM Xray negative Pt without any acute changes No significant bruising/swelling Discussed need for NWB (he has crutches/ace wrap at home) He wants to go back to work in 2 days but advised healing process may take longer Referred to sports medicine   Imaging Review Dg Ankle Complete Left  04/01/2014   CLINICAL DATA:  dropped a trailer on top of his left foot/left ankle this morning. C/o anterior pain with swelling and numbness in toes.  EXAM: LEFT ANKLE COMPLETE - 3+ VIEW  COMPARISON:  None.  FINDINGS: There is no evidence of fracture, dislocation, or joint effusion. Ankle mortise intact. There is no evidence of arthropathy or other focal bone abnormality. Soft tissues are unremarkable.  IMPRESSION: Negative.   Electronically Signed   By: Corlis Leak M.D.   On: 04/01/2014 08:58   Dg Foot Complete Left  04/01/2014   CLINICAL DATA:  dropped a trailer on top of his left foot/left ankle this morning. C/o anterior pain with swelling and numbness in toes.  EXAM: LEFT FOOT - COMPLETE  3+ VIEW  COMPARISON:  None.  FINDINGS: There is no evidence of fracture or dislocation. There is no evidence of arthropathy or other focal bone abnormality. Soft tissues are unremarkable.  IMPRESSION: Negative.   Electronically Signed   By: Corlis Leak M.D.   On: 04/01/2014 08:57      MDM   Final diagnoses:  Left ankle sprain, initial encounter  Foot sprain, left, initial encounter    Nursing notes including past medical history and social history reviewed and considered in documentation xrays/imaging reviewed by myself and considered during evaluation     Joya Gaskins, MD 04/01/14 [phone]

## 2014-04-01 NOTE — ED Notes (Addendum)
Patient here with left foot and ankle pain after dropping trailer on foot this am. Abrasion noted to same, minimal swelling. Pain with ambulation

## 2014-04-26 ENCOUNTER — Ambulatory Visit (INDEPENDENT_AMBULATORY_CARE_PROVIDER_SITE_OTHER): Payer: 59 | Admitting: Medical

## 2014-04-26 ENCOUNTER — Encounter: Payer: Self-pay | Admitting: Medical

## 2014-04-26 VITALS — BP 149/85 | HR 90 | Temp 98.4°F | Ht 73.25 in | Wt 290.8 lb

## 2014-04-26 DIAGNOSIS — G5602 Carpal tunnel syndrome, left upper limb: Secondary | ICD-10-CM | POA: Diagnosis not present

## 2014-04-26 DIAGNOSIS — G5600 Carpal tunnel syndrome, unspecified upper limb: Secondary | ICD-10-CM | POA: Insufficient documentation

## 2014-04-26 MED ORDER — DICLOFENAC SODIUM 75 MG PO TBEC: 75.0000 mg | DELAYED_RELEASE_TABLET | Freq: Two times a day (BID) | ORAL | Status: DC

## 2014-04-26 NOTE — Patient Instructions (Signed)
CTS (carpal tunnel syndrome) Discussed wrist cock up splints, rest and diclofenac rx.   Offered work note for tomorrow. Rest at least 3 days.     If symptoms persist and severe can refer to orthopedist.  Follow up in 2 weeks or as needed.

## 2014-04-26 NOTE — Progress Notes (Signed)
Pre visit review using our clinic review tool, if applicable. No additional management support is needed unless otherwise documented below in the visit note. 

## 2014-04-26 NOTE — Progress Notes (Signed)
Subjective:    Patient ID: Scott Pope, male    DOB: 03/11/1990, 24 y.o.   MRN: [id number]  HPI   Off an on hand numbness. 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 A lot of heavy lifting and repetitive activity. This has been for 2 weeks. With minimal acitivity hands get numb. Also sometims at rest.   Review of Systems  Constitutional: Negative for fever, chills, diaphoresis, activity change and fatigue.  Respiratory: Negative for cough, chest tightness and shortness of breath.   Cardiovascular: Negative for chest pain, palpitations and leg swelling.  Gastrointestinal: Negative for nausea, vomiting and abdominal pain.  Musculoskeletal: Negative for neck pain and neck stiffness.  Skin: Negative for pallor and rash.  Neurological: Negative for dizziness, tremors, seizures, syncope, facial asymmetry, speech difficulty, weakness, light-headedness, numbness and headaches.       Hands tingle and numb as stated in hop.  Hematological: Negative for adenopathy.  Psychiatric/Behavioral: Negative for behavioral problems, confusion and agitation. The patient is not nervous/anxious.    Past Medical History  Diagnosis Date  . Depression   . Overweight(278.02) 07/07/2010  . Acute low back pain 07/07/2010  . 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 ADHD (attention deficit hyperactivity disorder) 07/07/2010  . 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 GERD 01/14/2009  . ELEVATED BLOOD PRESSURE 01/14/2009  . Insomnia 08/04/2010  . Allergic state 09/04/2010  . Viral gastroenteritis 10/17/2010  . Otitis media of left ear 12/09/2010  . SOB (shortness of breath) on exertion 03/06/2011  . Skin lesion of left leg 04/01/2011  . Cough 04/06/2011  . Knee pain, left 09/24/2011  . Otitis media 11/24/2011  . Tick bite 10/17/2012  . Tinea corporis 11/22/2012  . Preventative health care 11/22/2012  . IBS (irritable bowel syndrome) 08/25/2013    History   Social History  . Marital Status: Single    Spouse  Name: N/A  . Number of Children: N/A  . Years of Education: N/A   Occupational History  . Not on file.   Social History Main Topics  . Smoking status: Never Smoker   . Smokeless tobacco: Never Used  . Alcohol Use: No  . Drug Use: No  . Sexual Activity: No   Other Topics Concern  . Not on file   Social History Narrative    Past Surgical History  Procedure Laterality Date  . Thyroglossal duct cyst      excised at 24 years of age  . Tympanostomy tube placement      multiple   . Circumcision  AT 24YRS OLD    HAD WILD REACTION TO SEDATION MED GIVEN    Family History  Problem Relation Age of Onset  . Obesity Mother   . Depression Mother   . Fibromyalgia Mother   . Hypertension Mother   . Chronic fatigue Mother   . Hypertension Father   . Fibromyalgia Maternal Grandmother   . Other Maternal Grandmother     reflux  . Other Maternal Grandfather     Lems disease  . Hypertension Paternal Grandfather   . Arthritis Paternal Grandfather   . Colon cancer Neg Hx     Allergies  Allergen Reactions  . Strattera [Atomoxetine Hcl]     Suicidal thoughts, depression    Current Outpatient Prescriptions on File Prior to Visit  Medication Sig Dispense Refill  . lisdexamfetamine (VYVANSE) 50 MG capsule Take 1 capsule (50 mg total) by mouth daily. RX for 02-17-14 30 capsule 0  .  metoprolol succinate (TOPROL-XL) 25 MG 24 hr tablet TAKE 1 TABLET (25 MG TOTAL) BY MOUTH DAILY. 90 tablet 2  . pantoprazole (PROTONIX) 40 MG tablet Take 1 tablet (40 mg total) by mouth daily. 30 tablet 2   No current facility-administered medications on file prior to visit.    BP 149/85 mmHg  Pulse 90  Temp(Src) 98.4 F (36.9 C) (Oral)  Ht 6' 1.25" (1.861 m)  Wt 290 lb 12.8 oz (131.906 kg)  BMI 38.09 kg/m2  SpO2 96%      Objective:   Physical Exam  General- No acute distress. Pleasant patient. Lungs- Clear, even and unlabored. Heart- regular rate and rhythm. Neurologic- CNII- XII grossly  intact. Lt wrist- + phalens sign. Occurs very quickly. Rt wrist- negative phalens sign. Pulses intact upper ext.       Assessment & Plan:  \

## 2014-04-26 NOTE — Assessment & Plan Note (Addendum)
Discussed wrist cock up splints, rest and diclofenac rx.   Offered work note for tomorrow. Rest at least 3 days.  Worse on left side. Some on rt side.

## 2014-05-09 ENCOUNTER — Telehealth: Payer: Self-pay | Admitting: Family Medicine

## 2014-05-09 NOTE — Telephone Encounter (Signed)
Patient needs refill on vyvanse 50 mg tab 1 tab po daily. He can have one 30 day supply but he has not been in for 6 months so to get anymore after that he will need to be seen

## 2014-05-10 MED ORDER — LISDEXAMFETAMINE DIMESYLATE 50 MG PO CAPS: 50.0000 mg | ORAL_CAPSULE | Freq: Every day | ORAL | Status: DC

## 2014-05-10 NOTE — Telephone Encounter (Signed)
Med filled and pt notified.  

## 2014-05-10 NOTE — Addendum Note (Signed)
Addended by: Jackson LatinoYLER, JESSICA L on: 05/10/2014 09:26 AM   Modules accepted: Orders

## 2014-05-16 ENCOUNTER — Other Ambulatory Visit: Payer: Self-pay | Admitting: Family Medicine

## 2014-06-28 ENCOUNTER — Telehealth: Payer: Self-pay | Admitting: Family Medicine

## 2014-06-28 NOTE — Telephone Encounter (Signed)
Caller name: Boyce MediciKaren Sonneborn  Relation to pt: mother  Call back number: [phone]   Reason for call:  Boyce MediciKaren Turnbough requesting a refill lisdexamfetamine (VYVANSE). Mother states she sent a message thru mychart requesting refill and was advised pt had to call directly. Mother states son works and can not call. Please advise

## 2014-06-28 NOTE — Telephone Encounter (Signed)
Last Refill on 05/10/14  #30 with 0 refill Last office visit 11/09/13 Next scheduled office visit 07/10/14  Advise on Vyvanse refill

## 2014-06-28 NOTE — Telephone Encounter (Signed)
i am willing to write him for a one month supply of Vyvanse to hold him but I have not seen him since September 2015 so he will need an appt for more since I am the prescriber responsible for this med and it is controlled.

## 2014-06-29 MED ORDER — LISDEXAMFETAMINE DIMESYLATE 50 MG PO CAPS: 50.0000 mg | ORAL_CAPSULE | Freq: Every day | ORAL | Status: DC

## 2014-06-29 NOTE — Telephone Encounter (Signed)
Mother notified. Rx placed at front desk.

## 2014-06-29 NOTE — Telephone Encounter (Signed)
Script printed and given to Gaye to have Dr. Blyth sign. JG//CMA  

## 2014-07-03 ENCOUNTER — Ambulatory Visit: Payer: 59 | Admitting: Family Medicine

## 2014-07-10 ENCOUNTER — Encounter: Payer: Self-pay | Admitting: Family Medicine

## 2014-07-10 ENCOUNTER — Ambulatory Visit (INDEPENDENT_AMBULATORY_CARE_PROVIDER_SITE_OTHER): Payer: 59 | Admitting: Family Medicine

## 2014-07-10 VITALS — BP 132/65 | HR 84 | Temp 98.3°F | Ht 75.0 in | Wt 282.4 lb

## 2014-07-10 DIAGNOSIS — G5602 Carpal tunnel syndrome, left upper limb: Secondary | ICD-10-CM

## 2014-07-10 DIAGNOSIS — G5601 Carpal tunnel syndrome, right upper limb: Secondary | ICD-10-CM | POA: Diagnosis not present

## 2014-07-10 DIAGNOSIS — M791 Myalgia: Secondary | ICD-10-CM

## 2014-07-10 DIAGNOSIS — K219 Gastro-esophageal reflux disease without esophagitis: Secondary | ICD-10-CM

## 2014-07-10 DIAGNOSIS — M609 Myositis, unspecified: Secondary | ICD-10-CM

## 2014-07-10 DIAGNOSIS — R51 Headache: Secondary | ICD-10-CM | POA: Diagnosis not present

## 2014-07-10 DIAGNOSIS — G5603 Carpal tunnel syndrome, bilateral upper limbs: Secondary | ICD-10-CM

## 2014-07-10 DIAGNOSIS — I10 Essential (primary) hypertension: Secondary | ICD-10-CM

## 2014-07-10 DIAGNOSIS — R519 Headache, unspecified: Secondary | ICD-10-CM

## 2014-07-10 DIAGNOSIS — IMO0001 Reserved for inherently not codable concepts without codable children: Secondary | ICD-10-CM

## 2014-07-10 NOTE — Patient Instructions (Addendum)
Splints, ice and salon pas patches to wrists each night Carpal Tunnel Syndrome Carpal tunnel syndrome is a disorder of the nervous system in the wrist that causes pain, hand weakness, and/or loss of feeling. Carpal tunnel syndrome is caused by the compression, stretching, or irritation of the median nerve at the wrist joint. Athletes who experience carpal tunnel syndrome may notice a decrease in their performance to the condition, especially for sports that require strong hand or wrist action.  SYMPTOMS   Tingling, numbness, or burning pain in the hand or fingers.  Inability to sleep due to pain in the hand.  Sharp pains that shoot from the wrist up the arm or to the fingers, especially at night.  Morning stiffness or cramping of the hand.  Thumb weakness, resulting in difficulty holding objects or making a fist.  Shiny, dry skin on the hand.  Reduced performance in any sport requiring a strong grip. CAUSES   Median nerve damage at the wrist is caused by pressure due to swelling, inflammation, or scarred tissue.  Sources of pressure include:  Repetitive gripping or squeezing that causes inflammation of the tendon sheaths.  Scarring or shortening of the ligament that covers the median nerve.  Traumatic injury to the wrist or forearm such as fracture, sprain, or dislocation.  Prolonged hyperextension (wrist bent backward) or hyperflexion (wrist bent downward) of the wrist. RISK INCREASES WITH:  Diabetes mellitus.  Menopause or amenorrhea.  Rheumatoid arthritis.  Raynaud disease.  Pregnancy.  Gout.  Kidney disease.  Ganglion cyst.  Repetitive hand or wrist action.  Hypothyroidism (underactive thyroid gland).  Repetitive jolting or shaking of the hands or wrist.  Prolonged forceful weight-bearing on the hands. PREVENTION  Bracing the hand and wrist straight during activities that involve repetitive grasping.  For activities that require prolonged extension of  the wrist (bending towards the top of the forearm) periodically change the position of your wrists.  Learn and use proper technique in activities that result in the wrist position in neutral to slight extension.  Avoid bending the wrist into full extension or flexion (up or down).  Keep the wrist in a straight (neutral) position. To keep the wrist in this position, wear a splint.  Avoid repetitive hand and wrist motions.  When possible avoid prolonged grasping of items (steering wheel of a car, a pen, a vacuum cleaner, or a rake).  Loosen your grip for activities that require prolonged grasping of items.  Place keyboards and writing surfaces at the correct height as to decrease strain on the wrist and hand.  Alternate work tasks to avoid prolonged wrist flexion.  Avoid pinching activities (needlework and writing) as they may irritate your carpal tunnel syndrome.  If these activities are necessary, complete them for shorter periods of time.  When writing, use a felt tip or rollerball pen and/or build up the grip on a pen to decrease the forces required for writing. PROGNOSIS  Carpal tunnel syndrome is usually curable with appropriate conservative treatment and sometimes resolves spontaneously. For some cases, surgery is necessary, especially if muscle wasting or nerve changes have developed.  RELATED COMPLICATIONS   Permanent numbness and a weak thumb or fingers in the affected hand.  Permanent paralysis of a portion of the hand and fingers. TREATMENT  Treatment initially consists of stopping activities that aggravate the symptoms as well as medication and ice to reduce inflammation. A wrist splint is often recommended for wear during activities of repetitive motion as well as at night. It  is also important to learn and use proper technique when performing activities that typically cause pain. On occasion, a corticosteroid injection may be given. If symptoms persist despite  conservative treatment, surgery may be an option. Surgical techniques free the pinched or compressed nerve. Carpal tunnel surgery is usually performed on an outpatient basis, meaning you go home the same day as surgery. These procedures provide almost complete relief of all symptoms in 95% of patients. Expect at least 2 weeks for healing after surgery. For cases that are the result of repeated jolting or shaking of the hand or wrist or prolonged hyperextension, surgery is not usually recommended because stretching of the median nerve, not compression, is usually the cause of carpal tunnel syndrome in these cases. MEDICATION   If pain medication is necessary, nonsteroidal anti-inflammatory medications, such as aspirin and ibuprofen, or other minor pain relievers, such as acetaminophen, are often recommended.  Do not take pain medication for 7 days before surgery.  Prescription pain relievers are usually only prescribed after surgery. Use only as directed and only as much as you need.  Corticosteroid injections may be given to reduce inflammation. However, they are not always recommended.  Vitamin B6 (pyridoxine) may reduce symptoms; use only if prescribed for your disorder. SEEK MEDICAL CARE IF:   Symptoms get worse or do not improve in 2 weeks despite treatment.  You also have a current or recent history of neck or shoulder injury that has resulted in pain or tingling elsewhere in your arm. Document Released: 02/02/2005 Document Revised: 06/19/2013 Document Reviewed: 05/17/2008 [facility] Patient Information 2015 [place], [place]. This information is not intended to replace advice given to you by your health care provider. Make sure you discuss any questions you have with your health care provider.

## 2014-07-10 NOTE — Progress Notes (Signed)
Pre visit review using our clinic review tool, if applicable. No additional management support is needed unless otherwise documented below in the visit note. 

## 2014-07-13 ENCOUNTER — Encounter (HOSPITAL_COMMUNITY): Payer: Self-pay | Admitting: Emergency Medicine

## 2014-07-13 ENCOUNTER — Emergency Department (HOSPITAL_COMMUNITY)
Admission: EM | Admit: 2014-07-13 | Discharge: 2014-07-14 | Disposition: A | Discharge status: Home / Self Care | Payer: No Typology Code available for payment source | Attending: Emergency Medicine | Admitting: Emergency Medicine

## 2014-07-13 DIAGNOSIS — Z79899 Other long term (current) drug therapy: Secondary | ICD-10-CM | POA: Insufficient documentation

## 2014-07-13 DIAGNOSIS — Y92410 Unspecified street and highway as the place of occurrence of the external cause: Secondary | ICD-10-CM | POA: Insufficient documentation

## 2014-07-13 DIAGNOSIS — Y9389 Activity, other specified: Secondary | ICD-10-CM | POA: Diagnosis not present

## 2014-07-13 DIAGNOSIS — M549 Dorsalgia, unspecified: Secondary | ICD-10-CM

## 2014-07-13 DIAGNOSIS — S80211A Abrasion, right knee, initial encounter: Secondary | ICD-10-CM | POA: Insufficient documentation

## 2014-07-13 DIAGNOSIS — R55 Syncope and collapse: Secondary | ICD-10-CM | POA: Insufficient documentation

## 2014-07-13 DIAGNOSIS — Q639 Congenital malformation of kidney, unspecified: Secondary | ICD-10-CM | POA: Insufficient documentation

## 2014-07-13 DIAGNOSIS — I10 Essential (primary) hypertension: Secondary | ICD-10-CM | POA: Insufficient documentation

## 2014-07-13 DIAGNOSIS — Z8619 Personal history of other infectious and parasitic diseases: Secondary | ICD-10-CM | POA: Insufficient documentation

## 2014-07-13 DIAGNOSIS — Z872 Personal history of diseases of the skin and subcutaneous tissue: Secondary | ICD-10-CM | POA: Diagnosis not present

## 2014-07-13 DIAGNOSIS — S3992XA Unspecified injury of lower back, initial encounter: Secondary | ICD-10-CM | POA: Insufficient documentation

## 2014-07-13 DIAGNOSIS — F909 Attention-deficit hyperactivity disorder, unspecified type: Secondary | ICD-10-CM | POA: Diagnosis not present

## 2014-07-13 DIAGNOSIS — E663 Overweight: Secondary | ICD-10-CM | POA: Diagnosis not present

## 2014-07-13 DIAGNOSIS — Y998 Other external cause status: Secondary | ICD-10-CM | POA: Diagnosis not present

## 2014-07-13 DIAGNOSIS — S91102A Unspecified open wound of left great toe without damage to nail, initial encounter: Secondary | ICD-10-CM | POA: Diagnosis not present

## 2014-07-13 DIAGNOSIS — S199XXA Unspecified injury of neck, initial encounter: Secondary | ICD-10-CM | POA: Diagnosis not present

## 2014-07-13 DIAGNOSIS — Z8669 Personal history of other diseases of the nervous system and sense organs: Secondary | ICD-10-CM | POA: Diagnosis not present

## 2014-07-13 DIAGNOSIS — M542 Cervicalgia: Secondary | ICD-10-CM

## 2014-07-13 DIAGNOSIS — K219 Gastro-esophageal reflux disease without esophagitis: Secondary | ICD-10-CM | POA: Diagnosis not present

## 2014-07-13 MED ORDER — HYDROMORPHONE HCL 1 MG/ML IJ SOLN
1.0000 mg | Freq: Once | INTRAMUSCULAR | Status: AC
  Administered 2014-07-14: 1 mg via INTRAVENOUS
  Filled 2014-07-13: qty 1
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
## 2014-07-14 ENCOUNTER — Emergency Department (HOSPITAL_COMMUNITY): Payer: No Typology Code available for payment source

## 2014-07-14 DIAGNOSIS — S199XXA Unspecified injury of neck, initial encounter: Secondary | ICD-10-CM | POA: Diagnosis not present

## 2014-07-14 MED ORDER — METHOCARBAMOL 500 MG PO TABS: 500.0000 mg | ORAL_TABLET | Freq: Two times a day (BID) | ORAL | Status: DC

## 2014-07-14 MED ORDER — NAPROXEN 500 MG PO TABS: 500.0000 mg | ORAL_TABLET | Freq: Two times a day (BID) | ORAL | Status: DC

## 2014-07-14 MED ORDER — OXYCODONE-ACETAMINOPHEN 5-325 MG PO TABS: 2.0000 | ORAL_TABLET | ORAL | Status: DC | PRN

## 2014-07-14 NOTE — Discharge Instructions (Signed)

## 2014-07-16 ENCOUNTER — Encounter: Payer: Self-pay | Admitting: Family Medicine

## 2014-07-16 NOTE — Assessment & Plan Note (Signed)
Well controlled, no changes to meds. Encouraged heart healthy diet such as the DASH diet and exercise as tolerated.  °

## 2014-07-16 NOTE — Assessment & Plan Note (Signed)
Avoid offending foods, start probiotics. Do not eat large meals in late evening and consider raising head of bed.  

## 2014-07-16 NOTE — Assessment & Plan Note (Signed)
Encouraged ice, salon pas and splinting, referred to ortho for further treatment

## 2014-07-16 NOTE — Progress Notes (Signed)
Scott PeekSpencer T Markham  [id number] 12/24/1990 07/16/2014      Progress Note-Follow Up  Subjective  Chief Complaint  Chief Complaint  Patient presents with  . Follow-up    HPI  Patient is a 24 y.o. male in today for routine medical care. Patient is in today for pain. He is complaining of pain in bilateral wrists. 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 Has trouble waking up with his hands hurting and numb on a frequent basis. He denies any actual trauma. He also has diffuse myalgias and arthralgias time no other acute illness. Denies CP/palp/SOB/HA/congestion/fevers/GI or GU c/o. Taking meds as prescribed  Past Medical History  Diagnosis Date  . Depression   . Overweight(278.02) 07/07/2010  . Acute low back pain 07/07/2010  . 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 ADHD (attention deficit hyperactivity disorder) 07/07/2010  . 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 GERD 01/14/2009  . ELEVATED BLOOD PRESSURE 01/14/2009  . Insomnia 08/04/2010  . Allergic state 09/04/2010  . Viral gastroenteritis 10/17/2010  . Otitis media of left ear 12/09/2010  . SOB (shortness of breath) on exertion 03/06/2011  . Skin lesion of left leg 04/01/2011  . Cough 04/06/2011  . Knee pain, left 09/24/2011  . Otitis media 11/24/2011  . Tick bite 10/17/2012  . Tinea corporis 11/22/2012  . Preventative health care 11/22/2012  . IBS (irritable bowel syndrome) 08/25/2013  . Benign essential HTN 01/14/2009    Qualifier: Diagnosis of  By: Caryl NeverBurchette MD, Bruce      Past Surgical History  Procedure Laterality Date  . Thyroglossal duct cyst      excised at 24 years of age  . Tympanostomy tube placement      multiple   . Circumcision  AT 24YRS OLD    HAD WILD REACTION TO SEDATION MED GIVEN    Family History  Problem Relation Age of Onset  . Obesity Mother   . Depression Mother   . Fibromyalgia Mother   . Hypertension Mother   . Chronic fatigue Mother   . Hypertension Father   .  Fibromyalgia Maternal Grandmother   . Other Maternal Grandmother     reflux  . Other Maternal Grandfather     Lems disease  . Hypertension Paternal Grandfather   . Arthritis Paternal Grandfather   . Colon cancer Neg Hx     History   Social History  . Marital Status: Single    Spouse Name: N/A  . Number of Children: N/A  . Years of Education: N/A   Occupational History  . Not on file.   Social History Main Topics  . Smoking status: Never Smoker   . Smokeless tobacco: Never Used  . Alcohol Use: No  . Drug Use: No  . Sexual Activity: No   Other Topics Concern  . Not on file   Social History Narrative    Current Outpatient Prescriptions on File Prior to Visit  Medication Sig Dispense Refill  . lisdexamfetamine (VYVANSE) 50 MG capsule Take 1 capsule (50 mg total) by mouth daily. 30 capsule 0  . metoprolol succinate (TOPROL-XL) 25 MG 24 hr tablet TAKE 1 TABLET (25 MG TOTAL) BY MOUTH DAILY. 90 tablet 2  . pantoprazole (PROTONIX) 40 MG tablet TAKE 1 TABLET (40 MG TOTAL) BY MOUTH DAILY. 30 tablet 2   No current facility-administered medications on file prior to visit.    Allergies  Allergen Reactions  . Strattera [Atomoxetine Hcl]  Suicidal thoughts, depression    Review of Systems  Review of Systems  Constitutional: Negative for fever and malaise/fatigue.  HENT: Negative for congestion.   Eyes: Negative for discharge.  Respiratory: Negative for shortness of breath.   Cardiovascular: Negative for chest pain, palpitations and leg swelling.  Gastrointestinal: Negative for nausea, abdominal pain and diarrhea.  Genitourinary: Negative for dysuria.  Musculoskeletal: Negative for falls.  Skin: Negative for rash.  Neurological: Negative for loss of consciousness and headaches.  Endo/Heme/Allergies: Negative for polydipsia.  Psychiatric/Behavioral: Negative for depression and suicidal ideas. The patient is not nervous/anxious and does not have insomnia.      Objective  BP 132/65 mmHg  Pulse 84  Temp(Src) 98.3 F (36.8 C) (Oral)  Ht  (1.905 m)  Wt 282 lb 6 oz (128.084 kg)  BMI 35.29 kg/m2  SpO2 99%  Physical Exam  Physical Exam  Constitutional: He is oriented to person, place, and time and well-developed, well-nourished, and in no distress. No distress.  HENT:  Head: Normocephalic and atraumatic.  Eyes: Conjunctivae are normal.  Neck: Neck supple. No thyromegaly present.  Cardiovascular: Normal rate, regular rhythm and normal heart sounds.   No murmur heard. Pulmonary/Chest: Effort normal and breath sounds normal. No respiratory distress.  Abdominal: He exhibits no distension and no mass. There is no tenderness.  Musculoskeletal: He exhibits no edema.  Neurological: He is alert and oriented to person, place, and time.  Skin: Skin is warm.  Psychiatric: Memory, affect and judgment normal.  Nursing note and vitals reviewed.   No results found for: TSH No results found for: WBC, HGB, HCT, MCV, PLT No results found for: CREATININE, BUN, NA, K, CL, CO2 No results found for: ALT, AST, GGT, ALKPHOS, BILITOT No results found for: CHOL No results found for: HDL No results found for: LDLCALC No results found for: TRIG No results found for: CHOLHDL   Assessment & Plan  GERD Avoid offending foods, start probiotics. Do not eat large meals in late evening and consider raising head of bed.    Benign essential HTN Well controlled, no changes to meds. Encouraged heart healthy diet such as the DASH diet and exercise as tolerated.    CTS (carpal tunnel syndrome) Encouraged ice, salon pas and splinting, referred to ortho for further treatment

## 2014-07-17 ENCOUNTER — Encounter: Payer: Self-pay | Admitting: Physician Assistant

## 2014-07-17 ENCOUNTER — Ambulatory Visit (INDEPENDENT_AMBULATORY_CARE_PROVIDER_SITE_OTHER): Payer: 59 | Admitting: Physician Assistant

## 2014-07-17 DIAGNOSIS — R51 Headache: Secondary | ICD-10-CM | POA: Diagnosis not present

## 2014-07-17 DIAGNOSIS — G44309 Post-traumatic headache, unspecified, not intractable: Secondary | ICD-10-CM | POA: Diagnosis not present

## 2014-07-17 DIAGNOSIS — R519 Headache, unspecified: Secondary | ICD-10-CM

## 2014-07-17 LAB — CBC WITH DIFFERENTIAL/PLATELET
BASOS PCT: 0.6 % (ref 0.0–3.0)
Basophils Absolute: 0 10*3/uL (ref 0.0–0.1)
Eosinophils Absolute: 0.3 10*3/uL (ref 0.0–0.7)
Eosinophils Relative: 3.5 % (ref 0.0–5.0)
HCT: 48.6 % (ref 39.0–52.0)
Hemoglobin: 16.5 g/dL (ref 13.0–17.0)
Lymphocytes Relative: 27.7 % (ref 12.0–46.0)
Lymphs Abs: 2.1 10*3/uL (ref 0.7–4.0)
MCHC: 33.9 g/dL (ref 30.0–36.0)
MCV: 87.3 fl (ref 78.0–100.0)
MONO ABS: 0.8 10*3/uL (ref 0.1–1.0)
Monocytes Relative: 10.5 % (ref 3.0–12.0)
NEUTROS ABS: 4.5 10*3/uL (ref 1.4–7.7)
Neutrophils Relative %: 57.7 % (ref 43.0–77.0)
PLATELETS: 293 10*3/uL (ref 150.0–400.0)
RBC: 5.56 Mil/uL (ref 4.22–5.81)
RDW: 13.1 % (ref 11.5–15.5)
WBC: 7.7 10*3/uL (ref 4.0–10.5)

## 2014-07-17 LAB — COMPREHENSIVE METABOLIC PANEL
ALT: 40 U/L (ref 0–53)
AST: 26 U/L (ref 0–37)
Albumin: 4.3 g/dL (ref 3.5–5.2)
Alkaline Phosphatase: 83 U/L (ref 39–117)
BUN: 16 mg/dL (ref 6–23)
CALCIUM: 9.5 mg/dL (ref 8.4–10.5)
CHLORIDE: 104 meq/L (ref 96–112)
CO2: 24 meq/L (ref 19–32)
Creatinine, Ser: 1.02 mg/dL (ref 0.40–1.50)
GFR: 97.23 mL/min (ref >60.00)
Glucose, Bld: 78 mg/dL (ref 70–99)
POTASSIUM: 4.3 meq/L (ref 3.5–5.1)
SODIUM: 136 meq/L (ref 135–145)
TOTAL PROTEIN: 7.3 g/dL (ref 6.0–8.3)
Total Bilirubin: 0.4 mg/dL (ref 0.2–1.2)

## 2014-07-17 LAB — SEDIMENTATION RATE: SED RATE: 8 mm/h (ref 0–22)

## 2014-07-17 NOTE — Assessment & Plan Note (Signed)
With symptoms of post-concussive syndrome.  Again CT exam was unremarkable. Continue Robaxin at bedtime.  Alternate tylenol and ibuprofen as needed for headache and aches.  Topical Aspercreme recommended. Return to work note written for patient to return this Thursday with limit 25 pound lifting and with need for multiple breaks.  Follow-up 1 week.
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
## 2014-07-17 NOTE — Patient Instructions (Signed)
Please use Excedrin or Tylenol if needed for headaches.  Stay well hydrated. Apply topical Aspercreme to the lower back.  Continue Robaxin at bedtime.   Symptoms will get better but will take some time.  Post-Concussion Syndrome Post-concussion syndrome describes the symptoms that can occur after a head injury. These symptoms can last from weeks to months. CAUSES  It is not clear why some head injuries cause post-concussion syndrome. It can occur whether your head injury was mild or severe and whether you were wearing head protection or not.  SIGNS AND SYMPTOMS  Memory difficulties.  Dizziness.  Headaches.  Double vision or blurry vision.  Sensitivity to light.  Hearing difficulties.  Depression.  Tiredness.  Weakness.  Difficulty with concentration.  Difficulty sleeping or staying asleep.  Vomiting.  Poor balance or instability on your feet.  Slow reaction time.  Difficulty learning and remembering things you have heard. DIAGNOSIS  There is no test to determine whether you have post-concussion syndrome. Your health care provider may order an imaging scan of your brain, such as a CT scan, to check for other problems that may be causing your symptoms (such as severe injury inside your skull). TREATMENT  Usually, these problems disappear over time without medical care. Your health care provider may prescribe medicine to help ease your symptoms. It is important to follow up with a neurologist to evaluate your recovery and address any lingering symptoms or issues. HOME CARE INSTRUCTIONS   Only take over-the-counter or prescription medicines for pain, discomfort, or fever as directed by your health care provider. Do not take aspirin. Aspirin can slow blood clotting.  Sleep with your head slightly elevated to help with headaches.  Avoid any situation where there is potential for another head injury (football, hockey, soccer, basketball, martial arts, downhill snow sports,  and horseback riding). Your condition will get worse every time you experience a concussion. You should avoid these activities until you are evaluated by the appropriate follow-up health care providers.  Keep all follow-up appointments as directed by your health care provider. SEEK IMMEDIATE MEDICAL CARE IF:  You develop confusion or unusual drowsiness.  You cannot wake the injured person.  You develop nausea or persistent, forceful vomiting.  You feel like you are moving when you are not (vertigo).  You notice the injured person's eyes moving rapidly back and forth. This may be a sign of vertigo.  You have convulsions or faint.  You have severe, persistent headaches that are not relieved by medicine.  You cannot use your arms or legs normally.  Your pupils change size.  You have clear or bloody discharge from the nose or ears.  Your problems are getting worse, not better. MAKE SURE YOU:  Understand these instructions.  Will watch your condition.  Will get help right away if you are not doing well or get worse. Document Released: 07/25/2001 Document Revised: 11/23/2012 Document Reviewed: 05/10/2013 [facility] Patient Information 2015 [place], [place]. This information is not intended to replace advice given to you by your health care provider. Make sure you discuss any questions you have with your health care provider.

## 2014-07-17 NOTE — Progress Notes (Signed)
Pre visit review using our clinic review tool, if applicable. No additional management support is needed unless otherwise documented below in the visit note. 

## 2014-07-18 LAB — ROCKY MTN SPOTTED FVR ABS PNL(IGG+IGM)
RMSF IGG: 0.92 IV — AB
RMSF IgM: 0.15 IV

## 2014-07-18 LAB — ANA: ANA: NEGATIVE

## 2014-07-18 LAB — LYME AB/WESTERN BLOT REFLEX: B BURGDORFERI AB IGG+ IGM: 0.33 {ISR}

## 2014-07-18 LAB — RHEUMATOID FACTOR: Rhuematoid fact SerPl-aCnc: <10 IU/mL (ref <=14)

## 2014-07-23 ENCOUNTER — Ambulatory Visit (INDEPENDENT_AMBULATORY_CARE_PROVIDER_SITE_OTHER): Payer: 59 | Admitting: Physician Assistant

## 2014-07-23 ENCOUNTER — Encounter: Payer: Self-pay | Admitting: Physician Assistant

## 2014-07-23 DIAGNOSIS — M545 Low back pain: Secondary | ICD-10-CM | POA: Diagnosis not present

## 2014-07-23 DIAGNOSIS — M542 Cervicalgia: Secondary | ICD-10-CM

## 2014-07-23 MED ORDER — METHOCARBAMOL 500 MG PO TABS: 500.0000 mg | ORAL_TABLET | Freq: Two times a day (BID) | ORAL | Status: DC

## 2014-07-23 NOTE — Progress Notes (Signed)
Pre visit review using our clinic review tool, if applicable. No additional management support is needed unless otherwise documented below in the visit note. 

## 2014-07-23 NOTE — Assessment & Plan Note (Signed)
Improving.  Still with some residual soreness after working.  Continue current regimen.  Patient will be released to full duty.

## 2014-07-23 NOTE — Patient Instructions (Signed)
Please continue current regimen. Apply topical Aspercreme to the neck. You can return to full duty, but let me know if this is too much for you.
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
## 2014-07-27 ENCOUNTER — Other Ambulatory Visit: Payer: 59

## 2014-09-02 ENCOUNTER — Other Ambulatory Visit: Payer: Self-pay | Admitting: Family Medicine

## 2014-09-04 ENCOUNTER — Other Ambulatory Visit: Payer: Self-pay | Admitting: Family Medicine

## 2014-09-04 MED ORDER — LISDEXAMFETAMINE DIMESYLATE 50 MG PO CAPS: 50.0000 mg | ORAL_CAPSULE | Freq: Every day | ORAL | Status: DC

## 2014-09-04 NOTE — Telephone Encounter (Signed)
Printed Vyvanse prescription as instructed by PCP.

## 2014-09-17 ENCOUNTER — Telehealth: Payer: Self-pay | Admitting: Family Medicine

## 2014-09-17 NOTE — Telephone Encounter (Signed)
Called to inform the patient his Vyvanse was printed on 08/25/14 and put at the front desk.  I did recheck and prescription is still at the front and he had not picked up yet.  The patient did verbalize understanding and stated he would pickup on 09/18/14 Tuesday.

## 2014-09-17 NOTE — Telephone Encounter (Signed)
Relation to ZO:XWRU  Call back [phone]   Reason for call:  Mother requesting a refill lisdexamfetamine (VYVANSE) 50 MG capsule

## 2014-09-21 ENCOUNTER — Other Ambulatory Visit: Payer: Self-pay | Admitting: Family Medicine

## 2014-09-21 MED ORDER — LISDEXAMFETAMINE DIMESYLATE 50 MG PO CAPS: 50.0000 mg | ORAL_CAPSULE | Freq: Every day | ORAL | Status: DC

## 2014-09-30 ENCOUNTER — Other Ambulatory Visit: Payer: Self-pay | Admitting: Family Medicine

## 2014-11-09 ENCOUNTER — Encounter: Payer: Self-pay | Admitting: Physician Assistant

## 2014-11-09 ENCOUNTER — Ambulatory Visit (INDEPENDENT_AMBULATORY_CARE_PROVIDER_SITE_OTHER): Payer: 59 | Admitting: Physician Assistant

## 2014-11-09 VITALS — BP 136/89 | HR 68 | Temp 97.7°F | Resp 16 | Ht 75.0 in | Wt 302.0 lb

## 2014-11-09 DIAGNOSIS — R0789 Other chest pain: Secondary | ICD-10-CM | POA: Diagnosis not present

## 2014-11-09 NOTE — Assessment & Plan Note (Signed)
EKG within normal limits bilaterally. Patient instructed to resume BP medications and GERD medications as directed. Stop NSAID use. Increase fluids. Call or return if symptoms are not resolving.

## 2014-11-09 NOTE — Progress Notes (Signed)
Pre visit review using our clinic review tool, if applicable. No additional management support is needed unless otherwise documented below in the visit note/SLS  

## 2014-11-09 NOTE — Progress Notes (Signed)
Patient presents to clinic today for follow-up of hypertension. Patient is taking his Metoprolol every few days when he remembers. Endorses chest pain that occurs almost every evening and lasting 1-2 seconds. Has history of stress and anxiety documented by PCP previously but patient previously declined medication.  Endorses heart burn is present despite Protonix daily. Is taking NSAIDs daily which is causing worsening of GERD symptoms.   Past Medical History  Diagnosis Date  . Depression   . Overweight(278.02) 07/07/2010  . Acute low back pain 07/07/2010  . 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 ADHD (attention deficit hyperactivity disorder) 07/07/2010  . 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 GERD 01/14/2009  . ELEVATED BLOOD PRESSURE 01/14/2009  . Insomnia 08/04/2010  . Allergic state 09/04/2010  . Viral gastroenteritis 10/17/2010  . Otitis media of left ear 12/09/2010  . SOB (shortness of breath) on exertion 03/06/2011  . Skin lesion of left leg 04/01/2011  . Cough 04/06/2011  . Knee pain, left 09/24/2011  . Otitis media 11/24/2011  . Tick bite 10/17/2012  . Tinea corporis 11/22/2012  . Preventative health care 11/22/2012  . IBS (irritable bowel syndrome) 08/25/2013  . Benign essential HTN 01/14/2009    Qualifier: Diagnosis of  By: Caryl Never MD, Bruce      Current Outpatient Prescriptions on File Prior to Visit  Medication Sig Dispense Refill  . lisdexamfetamine (VYVANSE) 50 MG capsule Take 1 capsule (50 mg total) by mouth daily. September 2016 30 capsule 0  . metoprolol succinate (TOPROL-XL) 25 MG 24 hr tablet TAKE 1 TABLET (25 MG TOTAL) BY MOUTH DAILY. 90 tablet 2  . naproxen (NAPROSYN) 500 MG tablet Take 1 tablet (500 mg total) by mouth 2 (two) times daily. 30 tablet 0  . pantoprazole (PROTONIX) 40 MG tablet TAKE 1 TABLET (40 MG TOTAL) BY MOUTH DAILY. 30 tablet 2   No current facility-administered medications on file prior to visit.    Allergies  Allergen Reactions  . Strattera  [Atomoxetine Hcl]     Suicidal thoughts, depression    Family History  Problem Relation Age of Onset  . Obesity Mother   . Depression Mother   . Fibromyalgia Mother   . Hypertension Mother   . Chronic fatigue Mother   . Hypertension Father   . Fibromyalgia Maternal Grandmother   . Other Maternal Grandmother     reflux  . Other Maternal Grandfather     Lems disease  . Hypertension Paternal Grandfather   . Arthritis Paternal Grandfather   . Colon cancer Neg Hx     Social History   Social History  . Marital Status: Single    Spouse Name: N/A  . Number of Children: N/A  . Years of Education: N/A   Social History Main Topics  . Smoking status: Never Smoker   . Smokeless tobacco: Never Used  . Alcohol Use: No  . Drug Use: No  . Sexual Activity: No   Other Topics Concern  . None   Social History Narrative   Review of Systems - See HPI.  All other ROS are negative.  BP 136/89 mmHg  Pulse 68  Temp(Src) 97.7 F (36.5 C) (Oral)  Resp 16  Ht  (1.905 m)  Wt 302 lb (136.986 kg)  BMI 37.75 kg/m2  Physical Exam  Constitutional: He is oriented to person, place, and time and well-developed, well-nourished, and in no distress.  HENT:  Head: Normocephalic and atraumatic.  Eyes: Conjunctivae are normal.  Neck: Neck supple. No thyromegaly  present.  Cardiovascular: Normal rate, regular rhythm, normal heart sounds and intact distal pulses.   Pulmonary/Chest: Effort normal and breath sounds normal. No respiratory distress. He has no wheezes. He has no rales. He exhibits no tenderness.  Lymphadenopathy:    He has no cervical adenopathy.  Neurological: He is alert and oriented to person, place, and time.  Skin: Skin is warm and dry. No rash noted.  Psychiatric: Affect normal.  Vitals reviewed.  Assessment/Plan: Atypical chest pain EKG within normal limits bilaterally. Patient instructed to resume BP medications and GERD medications as directed. Stop NSAID use. Increase  fluids. Call or return if symptoms are not resolving.

## 2014-11-09 NOTE — Patient Instructions (Signed)
Please take your blood pressure medication at the same time every day. Your EKG and exam looks good. Your symptoms are mainly stemming from worsening of your acid reflux due to all of the anti-inflammatories you are taking. Switch to Tylenol or Tylenol Arthritis. Continue your Protonix as directed. Add-on a Zantac at bedtime to help with acid reflux symptoms.  If symptoms are not resolving, please follow-up with [place] in office.

## 2014-12-08 ENCOUNTER — Other Ambulatory Visit: Payer: Self-pay | Admitting: Physician Assistant

## 2014-12-10 NOTE — Telephone Encounter (Signed)
Will defer further refills of patient's medications to PCP  

## 2015-02-19 ENCOUNTER — Other Ambulatory Visit: Payer: Self-pay | Admitting: Family Medicine

## 2015-02-20 ENCOUNTER — Telehealth: Payer: Self-pay | Admitting: Family Medicine

## 2015-02-20 NOTE — Telephone Encounter (Signed)
Can have 30 day supply of Vyvanse but needs appt for any more meds

## 2015-02-20 NOTE — Telephone Encounter (Signed)
Already addressed. See refill request note. Has not been seen since May per CMA comments. Last fill of Vyvanse was in August with no refills. I will defer fills to PCP since patient is only filling/using sporadically.

## 2015-02-20 NOTE — Telephone Encounter (Signed)
Caller name:Karen Relation to pt:mom Call back number:[phone] Pharmacy:  Reason for call: mom states she had sent a my chart request for pt's meds for rx lisdexamfetamine (VYVANSE) 50 MG capsule, states she did not realize Dr. Abner GreenspanBlyth was on vacation. States Selena BattenCody has written his rx before. Pt is needed rx, please call when available for pick up

## 2015-02-21 MED ORDER — LISDEXAMFETAMINE DIMESYLATE 50 MG PO CAPS: 50.0000 mg | ORAL_CAPSULE | Freq: Every day | ORAL | Status: DC

## 2015-02-21 NOTE — Telephone Encounter (Signed)
Printed prescription and did call the mom to inform appt. Scheduled. asap to continue refills.

## 2015-02-26 ENCOUNTER — Telehealth: Payer: Self-pay | Admitting: *Deleted

## 2015-02-26 NOTE — Telephone Encounter (Signed)
REceived fax from pharmacy for PA on Vyvanse, attempted to file electronically via Cover My Meds, would not accept; called at [phone] spoke with Marchelle FolksAmanda, she couldn't accept thru system, so she faxed me the paper form; completed and fax to [phone] [[facility]], awaiting response/SLS 01/10

## 2015-02-27 MED FILL — VYVANSE 50 MG CAPSULE: 50 | 30 days supply | Qty: 30 | Fill #0

## 2015-02-27 NOTE — Telephone Encounter (Signed)
[facility] until 11/25/2016; faxed to pharmacy/SLS

## 2015-03-20 ENCOUNTER — Ambulatory Visit (INDEPENDENT_AMBULATORY_CARE_PROVIDER_SITE_OTHER): Payer: 59 | Admitting: Family Medicine

## 2015-03-20 ENCOUNTER — Encounter: Payer: Self-pay | Admitting: Family Medicine

## 2015-03-20 VITALS — BP 152/96 | HR 98 | Temp 98.0°F | Ht 75.0 in | Wt 311.8 lb

## 2015-03-20 DIAGNOSIS — K219 Gastro-esophageal reflux disease without esophagitis: Secondary | ICD-10-CM

## 2015-03-20 DIAGNOSIS — I10 Essential (primary) hypertension: Secondary | ICD-10-CM

## 2015-03-20 DIAGNOSIS — F411 Generalized anxiety disorder: Secondary | ICD-10-CM

## 2015-03-20 DIAGNOSIS — F4323 Adjustment disorder with mixed anxiety and depressed mood: Secondary | ICD-10-CM

## 2015-03-20 DIAGNOSIS — R0600 Dyspnea, unspecified: Secondary | ICD-10-CM | POA: Diagnosis not present

## 2015-03-20 DIAGNOSIS — R0789 Other chest pain: Secondary | ICD-10-CM | POA: Diagnosis not present

## 2015-03-20 DIAGNOSIS — E663 Overweight: Secondary | ICD-10-CM

## 2015-03-20 MED ORDER — ALPRAZOLAM 0.25 MG PO TABS: 0.2500 mg | ORAL_TABLET | Freq: Two times a day (BID) | ORAL | Status: DC | PRN

## 2015-03-20 MED ORDER — ESCITALOPRAM OXALATE 10 MG PO TABS: 10.0000 mg | ORAL_TABLET | Freq: Every day | ORAL | Status: DC

## 2015-03-20 MED ORDER — METOPROLOL SUCCINATE ER 50 MG PO TB24: 50.0000 mg | ORAL_TABLET | Freq: Every day | ORAL | Status: DC

## 2015-03-20 NOTE — Patient Instructions (Signed)
BP 145/95   Hypertension Hypertension, commonly called high blood pressure, is when the force of blood pumping through your arteries is too strong. Your arteries are the blood vessels that carry blood from your heart throughout your body. A blood pressure reading consists of a higher number over a lower number, such as 110/72. The higher number (systolic) is the pressure inside your arteries when your heart pumps. The lower number (diastolic) is the pressure inside your arteries when your heart relaxes. Ideally you want your blood pressure below 120/80. Hypertension forces your heart to work harder to pump blood. Your arteries may become narrow or stiff. Having untreated or uncontrolled hypertension can cause heart attack, stroke, kidney disease, and other problems. RISK FACTORS Some risk factors for high blood pressure are controllable. Others are not.  Risk factors you cannot control include:   Race. You may be at higher risk if you are African American.  Age. Risk increases with age.  Gender. Men are at higher risk than women before age 48 years. After age 65, women are at higher risk than men. Risk factors you can control include:  Not getting enough exercise or physical activity.  Being overweight.  Getting too much fat, sugar, calories, or salt in your diet.  Drinking too much alcohol. SIGNS AND SYMPTOMS Hypertension does not usually cause signs or symptoms. Extremely high blood pressure (hypertensive crisis) may cause headache, anxiety, shortness of breath, and nosebleed. DIAGNOSIS To check if you have hypertension, your health care provider will measure your blood pressure while you are seated, with your arm held at the level of your heart. It should be measured at least twice using the same arm. Certain conditions can cause a difference in blood pressure between your right and left arms. A blood pressure reading that is higher than normal on one occasion does not mean that you  need treatment. If it is not clear whether you have high blood pressure, you may be asked to return on a different day to have your blood pressure checked again. Or, you may be asked to monitor your blood pressure at home for 1 or more weeks. TREATMENT Treating high blood pressure includes making lifestyle changes and possibly taking medicine. Living a healthy lifestyle can help lower high blood pressure. You may need to change some of your habits. Lifestyle changes may include:  Following the DASH diet. This diet is high in fruits, vegetables, and whole grains. It is low in salt, red meat, and added sugars.  Keep your sodium intake below 2,300 mg per day.  Getting at least 30-45 minutes of aerobic exercise at least 4 times per week.  Losing weight if necessary.  Not smoking.  Limiting alcoholic beverages.  Learning ways to reduce stress. Your health care provider may prescribe medicine if lifestyle changes are not enough to get your blood pressure under control, and if one of the following is true:  You are 64-61 years of age and your systolic blood pressure is above 140.  You are 54 years of age or older, and your systolic blood pressure is above 150.  Your diastolic blood pressure is above 90.  You have diabetes, and your systolic blood pressure is over 140 or your diastolic blood pressure is over 90.  You have kidney disease and your blood pressure is above 140/90.  You have heart disease and your blood pressure is above 140/90. Your personal target blood pressure may vary depending on your medical conditions, your age, and other  factors. HOME CARE INSTRUCTIONS  Have your blood pressure rechecked as directed by your health care provider.   Take medicines only as directed by your health care provider. Follow the directions carefully. Blood pressure medicines must be taken as prescribed. The medicine does not work as well when you skip doses. Skipping doses also puts you at  risk for problems.  Do not smoke.   Monitor your blood pressure at home as directed by your health care provider. SEEK MEDICAL CARE IF:   You think you are having a reaction to medicines taken.  You have recurrent headaches or feel dizzy.  You have swelling in your ankles.  You have trouble with your vision. SEEK IMMEDIATE MEDICAL CARE IF:  You develop a severe headache or confusion.  You have unusual weakness, numbness, or feel faint.  You have severe chest or abdominal pain.  You vomit repeatedly.  You have trouble breathing. MAKE SURE YOU:   Understand these instructions.  Will watch your condition.  Will get help right away if you are not doing well or get worse.   This information is not intended to replace advice given to you by your health care provider. Make sure you discuss any questions you have with your health care provider.   Document Released: 02/02/2005 Document Revised: 06/19/2014 Document Reviewed: 11/25/2012 Elsevier Interactive Patient Education [employer].

## 2015-03-20 NOTE — Progress Notes (Signed)
Pre visit review using our clinic review tool, if applicable. No additional management support is needed unless otherwise documented below in the visit note. 
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
## 2015-03-21 ENCOUNTER — Other Ambulatory Visit: Payer: Self-pay | Admitting: Family Medicine

## 2015-03-21 DIAGNOSIS — R1013 Epigastric pain: Secondary | ICD-10-CM

## 2015-03-21 DIAGNOSIS — R945 Abnormal results of liver function studies: Secondary | ICD-10-CM

## 2015-03-21 DIAGNOSIS — R7989 Other specified abnormal findings of blood chemistry: Secondary | ICD-10-CM

## 2015-03-21 LAB — LIPID PANEL
CHOLESTEROL: 160 mg/dL (ref 0–200)
HDL: 39.6 mg/dL (ref >39.00)
LDL Cholesterol: 84 mg/dL (ref 0–99)
NonHDL: 120.86
TRIGLYCERIDES: 185 mg/dL — AB (ref 0.0–149.0)
Total CHOL/HDL Ratio: 4
VLDL: 37 mg/dL (ref 0.0–40.0)

## 2015-03-21 LAB — CBC
HEMATOCRIT: 50.5 % (ref 39.0–52.0)
HEMOGLOBIN: 17.1 g/dL — AB (ref 13.0–17.0)
MCHC: 33.8 g/dL (ref 30.0–36.0)
MCV: 87.4 fl (ref 78.0–100.0)
Platelets: 308 10*3/uL (ref 150.0–400.0)
RBC: 5.78 Mil/uL (ref 4.22–5.81)
RDW: 13.1 % (ref 11.5–15.5)
WBC: 8.1 10*3/uL (ref 4.0–10.5)

## 2015-03-21 LAB — COMPREHENSIVE METABOLIC PANEL
ALBUMIN: 4.3 g/dL (ref 3.5–5.2)
ALK PHOS: 83 U/L (ref 39–117)
ALT: 127 U/L — ABNORMAL HIGH (ref 0–53)
AST: 72 U/L — ABNORMAL HIGH (ref 0–37)
BUN: 14 mg/dL (ref 6–23)
CO2: 23 mEq/L (ref 19–32)
Calcium: 9.5 mg/dL (ref 8.4–10.5)
Chloride: 107 mEq/L (ref 96–112)
Creatinine, Ser: 1.1 mg/dL (ref 0.40–1.50)
GFR: 88.57 mL/min (ref >60.00)
Glucose, Bld: 103 mg/dL — ABNORMAL HIGH (ref 70–99)
POTASSIUM: 4.3 meq/L (ref 3.5–5.1)
Sodium: 139 mEq/L (ref 135–145)
TOTAL PROTEIN: 7.3 g/dL (ref 6.0–8.3)
Total Bilirubin: 0.5 mg/dL (ref 0.2–1.2)

## 2015-03-21 LAB — TSH: TSH: 1.27 u[IU]/mL (ref 0.35–4.50)

## 2015-03-24 ENCOUNTER — Encounter: Payer: Self-pay | Admitting: Family Medicine

## 2015-03-24 NOTE — Assessment & Plan Note (Signed)
Minimize caffeine and increase Metoprolol xl to 50 mg daily

## 2015-03-24 NOTE — Assessment & Plan Note (Signed)
Encouraged DASH diet, decrease po intake and increase exercise as tolerated. Needs 7-8 hours of sleep nightly. Avoid trans fats, eat small, frequent meals every 4-5 hours with lean proteins, complex carbs and healthy fats and minimize simple carbs

## 2015-03-24 NOTE — Assessment & Plan Note (Signed)
Avoid offending foods, start probiotics. Do not eat large meals in late evening and consider raising head of bed.  
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
## 2015-03-24 NOTE — Assessment & Plan Note (Signed)
C/w anxiety attacks but with history of hypertension will proceed with Echocardiogram to evaluate his heart , he will seek care if pain recures and persists. EKG unchanged.

## 2015-03-25 ENCOUNTER — Telehealth: Payer: Self-pay | Admitting: Family Medicine

## 2015-03-25 NOTE — Telephone Encounter (Signed)
Got it when is his abdominal ultrasound? If it is not today we should move that up since his symptoms are worsening

## 2015-03-25 NOTE — Telephone Encounter (Signed)
Caller name: Clydie Braun   Relationship to patient: Mother   Can be reached: [phone]   Reason for call: Pt's mom would like to know if her son could have a ultra sound instead of a Echocardiogram because she says that echo is to expensive and his heart is fine. She would like to be advised further.

## 2015-03-25 NOTE — Telephone Encounter (Signed)
Called left message to call back 

## 2015-03-25 NOTE — Telephone Encounter (Signed)
So an Echo is the ultrasound of the heart there is no separate option. I agree his heart is probably fine but with his history of Hi blood pressure it can slowly enlarge the heart muscle so I was just trying to rule that out. If he is feeling better we can wait for now.

## 2015-03-25 NOTE — Telephone Encounter (Signed)
They are ok with doing the echo, but it will cost around $1000 and they feel like his gallbladder is the problem right now.  Mom states his symptoms have worsened, he is ok if he does not eat, but if he does eat he is in a lot of pain.

## 2015-03-26 ENCOUNTER — Ambulatory Visit (HOSPITAL_BASED_OUTPATIENT_CLINIC_OR_DEPARTMENT_OTHER)
Admission: RE | Admit: 2015-03-26 | Discharge: 2015-03-26 | Disposition: A | Discharge status: Home / Self Care | Payer: 59 | Source: Ambulatory Visit | Attending: Family Medicine | Admitting: Family Medicine

## 2015-03-26 ENCOUNTER — Encounter: Payer: Self-pay | Admitting: Family Medicine

## 2015-03-26 ENCOUNTER — Ambulatory Visit (HOSPITAL_COMMUNITY): Payer: 59

## 2015-03-26 DIAGNOSIS — R1013 Epigastric pain: Secondary | ICD-10-CM

## 2015-03-26 DIAGNOSIS — R7989 Other specified abnormal findings of blood chemistry: Secondary | ICD-10-CM | POA: Diagnosis not present

## 2015-03-26 DIAGNOSIS — R932 Abnormal findings on diagnostic imaging of liver and biliary tract: Secondary | ICD-10-CM | POA: Insufficient documentation

## 2015-03-26 DIAGNOSIS — R5383 Other fatigue: Secondary | ICD-10-CM | POA: Diagnosis not present

## 2015-03-26 DIAGNOSIS — R945 Abnormal results of liver function studies: Secondary | ICD-10-CM

## 2015-03-26 NOTE — Telephone Encounter (Signed)
Patients mom notified to pickup letter at the front desk U/S has been scheduled today and pt. Aware.

## 2015-03-26 NOTE — Telephone Encounter (Signed)
Please write him a letter for off work for next 2 weeks for acute illness     ----- Message -----     From: Pincus Sanes, CMA     Sent: 03/25/2015  7:17 AM      To: Bradd Canary, MD    Subject: FW: Non-Urgent Medical Question                       ----- Message -----     From: Jeanella Cara     Sent: 03/23/2015  3:57 AM      To: [facility]    Subject: Non-Urgent Medical Question                 Dr B    can we write Scott Pope out of work for a couple of weeks. He is having trouble doing his job with the chest pain. His boss ask him if he ought to take some time off. I'm truly wondering if this is gallbladder trouble. 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 He was good tonight then he ate [place] then his chest started hurting and has hurt most of the night. This is your call about writing him out of work.         demorio seeley    [phone]        I had to copy/paste this my chart message from his mom into his chart in order to do letter.

## 2015-03-27 ENCOUNTER — Ambulatory Visit (HOSPITAL_BASED_OUTPATIENT_CLINIC_OR_DEPARTMENT_OTHER): Payer: 59

## 2015-04-04 ENCOUNTER — Ambulatory Visit (INDEPENDENT_AMBULATORY_CARE_PROVIDER_SITE_OTHER): Payer: 59 | Admitting: Family Medicine

## 2015-04-04 ENCOUNTER — Encounter: Payer: Self-pay | Admitting: Family Medicine

## 2015-04-04 ENCOUNTER — Ambulatory Visit (HOSPITAL_BASED_OUTPATIENT_CLINIC_OR_DEPARTMENT_OTHER)
Admission: RE | Admit: 2015-04-04 | Discharge: 2015-04-04 | Disposition: A | Discharge status: Home / Self Care | Payer: 59 | Source: Ambulatory Visit | Attending: Family Medicine | Admitting: Family Medicine

## 2015-04-04 VITALS — BP 130/90 | HR 87 | Temp 97.8°F | Ht 75.0 in | Wt 312.1 lb

## 2015-04-04 DIAGNOSIS — R197 Diarrhea, unspecified: Secondary | ICD-10-CM | POA: Insufficient documentation

## 2015-04-04 DIAGNOSIS — R1011 Right upper quadrant pain: Secondary | ICD-10-CM | POA: Insufficient documentation

## 2015-04-04 DIAGNOSIS — R1110 Vomiting, unspecified: Secondary | ICD-10-CM | POA: Diagnosis not present

## 2015-04-04 DIAGNOSIS — K5900 Constipation, unspecified: Secondary | ICD-10-CM | POA: Insufficient documentation

## 2015-04-04 DIAGNOSIS — R1013 Epigastric pain: Secondary | ICD-10-CM | POA: Diagnosis not present

## 2015-04-04 DIAGNOSIS — R079 Chest pain, unspecified: Secondary | ICD-10-CM | POA: Diagnosis not present

## 2015-04-04 DIAGNOSIS — I10 Essential (primary) hypertension: Secondary | ICD-10-CM | POA: Diagnosis not present

## 2015-04-04 HISTORY — DX: Right upper quadrant pain: R10.11

## 2015-04-04 HISTORY — DX: Epigastric pain: R10.13

## 2015-04-04 NOTE — Assessment & Plan Note (Signed)
Avoid offending foods, start probiotics NOW company has a 10 strain. Do not eat large meals in late evening and consider raising head of bed.

## 2015-04-04 NOTE — Progress Notes (Signed)
Subjective:    Patient ID: Scott Pope, male    DOB: [birth year], 25 y.o.   MRN: [id number]  Chief Complaint  Patient presents with  . Follow-up    HPI Patient is in today for follow up with numerous concerns. acommpanied by his father. He continues to struggle with epigastric and RUQ pain intermittently, mostly after meals. No vomiting, but some nausea noted when pain is bad especially. Also notes some sweaty feeling with pain. No other new acute concerns. Denies CP/palp/SOB/HA/congestion/fevers or GU c/o. Taking meds as prescribed   Past Medical History  Diagnosis Date  . Depression   . Overweight(278.02) 07/07/2010  . Acute low back pain 07/07/2010  . 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 ADHD (attention deficit hyperactivity disorder) 07/07/2010  . 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 GERD 01/14/2009  . ELEVATED BLOOD PRESSURE 01/14/2009  . Insomnia 08/04/2010  . Allergic state 09/04/2010  . Viral gastroenteritis 10/17/2010  . Otitis media of left ear 12/09/2010  . SOB (shortness of breath) on exertion 03/06/2011  . Skin lesion of left leg 04/01/2011  . Cough 04/06/2011  . Knee pain, left 09/24/2011  . Otitis media 11/24/2011  . Tick bite 10/17/2012  . Tinea corporis 11/22/2012  . Preventative health care 11/22/2012  . IBS (irritable bowel syndrome) 08/25/2013  . Benign essential HTN 01/14/2009    Qualifier: Diagnosis of  By: Caryl Never MD, Bruce    . Abdominal pain, epigastric 04/04/2015  . RUQ pain 04/04/2015    Past Surgical History  Procedure Laterality Date  . Thyroglossal duct cyst      excised at 25 years of age  . Tympanostomy tube placement      multiple   . Circumcision  AT 25YRS OLD    HAD WILD REACTION TO SEDATION MED GIVEN    Family History  Problem Relation Age of Onset  . Obesity Mother   . Depression Mother   . Fibromyalgia Mother   . Hypertension Mother   . Chronic fatigue Mother   . Hypertension Father   . Fibromyalgia Maternal Grandmother   . Other  Maternal Grandmother     reflux  . Other Maternal Grandfather     Lems disease  . Hypertension Paternal Grandfather   . Arthritis Paternal Grandfather   . Colon cancer Neg Hx     Social History   Social History  . Marital Status: Single    Spouse Name: N/A  . Number of Children: N/A  . Years of Education: N/A   Occupational History  . Not on file.   Social History Main Topics  . Smoking status: Never Smoker   . Smokeless tobacco: Never Used  . Alcohol Use: No  . Drug Use: No  . Sexual Activity: No   Other Topics Concern  . Not on file   Social History Narrative    Outpatient Prescriptions Prior to Visit  Medication Sig Dispense Refill  . escitalopram (LEXAPRO) 10 MG tablet Take 1 tablet (10 mg total) by mouth daily. 30 tablet 2  . lisdexamfetamine (VYVANSE) 50 MG capsule Take 1 capsule (50 mg total) by mouth daily. Jan. 2017 30 capsule 0  . metoprolol succinate (TOPROL-XL) 50 MG 24 hr tablet Take 1 tablet (50 mg total) by mouth daily. Take with or immediately following a meal. 30 tablet 3  . pantoprazole (PROTONIX) 40 MG tablet TAKE 1 TABLET (40 MG TOTAL) BY MOUTH DAILY. 30 tablet 2  . ALPRAZolam (XANAX) 0.25 MG tablet Take 1 tablet (  0.25 mg total) by mouth 2 (two) times daily as needed for anxiety. (Patient not taking: Reported on 04/04/2015) 10 tablet 0   No facility-administered medications prior to visit.    Allergies  Allergen Reactions  . Strattera [Atomoxetine Hcl]     Suicidal thoughts, depression    Review of Systems  Constitutional: Negative for fever and malaise/fatigue.  HENT: Negative for congestion.   Eyes: Negative for discharge.  Respiratory: Negative for shortness of breath.   Cardiovascular: Negative for chest pain, palpitations and leg swelling.  Gastrointestinal: Positive for abdominal pain. Negative for nausea and vomiting.  Genitourinary: Negative for dysuria.  Musculoskeletal: Negative for falls.  Skin: Negative for rash.    Neurological: Negative for loss of consciousness and headaches.  Endo/Heme/Allergies: Negative for environmental allergies.  Psychiatric/Behavioral: Negative for depression. The patient is not nervous/anxious.        Objective:    Physical Exam  Constitutional: He is oriented to person, place, and time. He appears well-developed and well-nourished. No distress.  HENT:  Head: Normocephalic and atraumatic.  Eyes: Conjunctivae are normal.  Neck: Neck supple. No thyromegaly present.  Cardiovascular: Normal rate, regular rhythm and normal heart sounds.   No murmur heard. Pulmonary/Chest: Effort normal and breath sounds normal. No respiratory distress. He has no wheezes.  Abdominal: Soft. Bowel sounds are normal. He exhibits no mass. There is no tenderness.  Musculoskeletal: He exhibits no edema.  Lymphadenopathy:    He has no cervical adenopathy.  Neurological: He is alert and oriented to person, place, and time.  Skin: Skin is warm and dry.  Psychiatric: He has a normal mood and affect. His behavior is normal.    BP 130/90 mmHg  Pulse 87  Temp(Src) 97.8 F (36.6 C) (Oral)  Ht  (1.905 m)  Wt 312 lb 2 oz (141.579 kg)  BMI 39.01 kg/m2  SpO2 97% Wt Readings from Last 3 Encounters:  04/04/15 312 lb 2 oz (141.579 kg)  03/20/15 311 lb 12.8 oz (141.432 kg)  11/09/14 302 lb (136.986 kg)     Lab Results  Component Value Date   WBC 8.1 03/20/2015   HGB 17.1* 03/20/2015   HCT 50.5 03/20/2015   PLT 308.0 03/20/2015   GLUCOSE 103* 03/20/2015   CHOL 160 03/20/2015   TRIG 185.0* 03/20/2015   HDL 39.60 03/20/2015   LDLCALC 84 03/20/2015   ALT 127* 03/20/2015   AST 72* 03/20/2015   NA 139 03/20/2015   K 4.3 03/20/2015   CL 107 03/20/2015   CREATININE 1.10 03/20/2015   BUN 14 03/20/2015   CO2 23 03/20/2015   TSH 1.27 03/20/2015    Lab Results  Component Value Date   TSH 1.27 03/20/2015   Lab Results  Component Value Date   WBC 8.1 03/20/2015   HGB 17.1*  03/20/2015   HCT 50.5 03/20/2015   MCV 87.4 03/20/2015   PLT 308.0 03/20/2015   Lab Results  Component Value Date   NA 139 03/20/2015   K 4.3 03/20/2015   CO2 23 03/20/2015   GLUCOSE 103* 03/20/2015   BUN 14 03/20/2015   CREATININE 1.10 03/20/2015   BILITOT 0.5 03/20/2015   ALKPHOS 83 03/20/2015   AST 72* 03/20/2015   ALT 127* 03/20/2015   PROT 7.3 03/20/2015   ALBUMIN 4.3 03/20/2015   CALCIUM 9.5 03/20/2015   GFR 88.57 03/20/2015   Lab Results  Component Value Date   CHOL 160 03/20/2015   Lab Results  Component Value Date   HDL  39.60 03/20/2015   Lab Results  Component Value Date   LDLCALC 84 03/20/2015   Lab Results  Component Value Date   TRIG 185.0* 03/20/2015   Lab Results  Component Value Date   CHOLHDL 4 03/20/2015   No results found for: HGBA1C     Assessment & Plan:   Problem List Items Addressed This Visit    Benign essential HTN    Well controlled, no changes to meds. Encouraged heart healthy diet such as the DASH diet and exercise as tolerated.       RUQ pain - Primary    Pattern of pain c/w gallbladder disease but Ultrasound was negative, is referred to the gastroenterology for further investigation and encouraged to avoid fatty and spicy foods.      Relevant Orders   Ambulatory referral to Gastroenterology   DG Abd 2 Views (Completed)      I am having Mr. Debella maintain his pantoprazole, lisdexamfetamine, metoprolol succinate, escitalopram, and ALPRAZolam.  No orders of the defined types were placed in this encounter.     Danise Edge, MD

## 2015-04-04 NOTE — Patient Instructions (Addendum)
NOW 10 strain Probiotic Fiber supplements Metamucil or Benefiber  Twice daily  Cholelithiasis Cholelithiasis (also called gallstones) is a form of gallbladder disease in which gallstones form in your gallbladder. The gallbladder is an organ that stores bile made in the liver, which helps digest fats. Gallstones begin as small crystals and slowly grow into stones. Gallstone pain occurs when the gallbladder spasms and a gallstone is blocking the duct. Pain can also occur when a stone passes out of the duct.  RISK FACTORS  Being male.   Having multiple pregnancies. Health care providers sometimes advise removing diseased gallbladders before future pregnancies.   Being obese.  Eating a diet heavy in fried foods and fat.   Being older than 60 years and increasing age.   Prolonged use of medicines containing male hormones.   Having diabetes mellitus.   Rapidly losing weight.   Having a family history of gallstones (heredity).  SYMPTOMS  Nausea.   Vomiting.  Abdominal pain.   Yellowing of the skin (jaundice).   Sudden pain. It may persist from several minutes to several hours.  Fever.   Tenderness to the touch. In some cases, when gallstones do not move into the bile duct, people have no pain or symptoms. These are called "silent" gallstones.  TREATMENT Silent gallstones do not need treatment. In severe cases, emergency surgery may be required. Options for treatment include:  Surgery to remove the gallbladder. This is the most common treatment.  Medicines. These do not always work and may take 6-12 months or more to work.  Shock wave treatment (extracorporeal biliary lithotripsy). In this treatment an ultrasound machine sends shock waves to the gallbladder to break gallstones into smaller pieces that can pass into the intestines or be dissolved by medicine. HOME CARE INSTRUCTIONS   Only take over-the-counter or prescription medicines for pain, discomfort,  or fever as directed by your health care provider.   Follow a low-fat diet until seen again by your health care provider. Fat causes the gallbladder to contract, which can result in pain.   Follow up with your health care provider as directed. Attacks are almost always recurrent and surgery is usually required for permanent treatment.  SEEK IMMEDIATE MEDICAL CARE IF:   Your pain increases and is not controlled by medicines.   You have a fever or persistent symptoms for more than 2-3 days.   You have a fever and your symptoms suddenly get worse.   You have persistent nausea and vomiting.  MAKE SURE YOU:   Understand these instructions.  Will watch your condition.  Will get help right away if you are not doing well or get worse.   This information is not intended to replace advice given to you by your health care provider. Make sure you discuss any questions you have with your health care provider.   Document Released: 01/29/2005 Document Revised: 10/05/2012 Document Reviewed: 07/27/2012 Elsevier Interactive Patient Education [employer]

## 2015-04-04 NOTE — Progress Notes (Signed)
Pre visit review using our clinic review tool, if applicable. No additional management support is needed unless otherwise documented below in the visit note. 

## 2015-04-14 ENCOUNTER — Encounter: Payer: Self-pay | Admitting: Family Medicine

## 2015-04-14 NOTE — Assessment & Plan Note (Signed)
Pattern of pain c/w gallbladder disease but Ultrasound was negative, is referred to the gastroenterology for further investigation and encouraged to avoid fatty and spicy foods.

## 2015-04-14 NOTE — Assessment & Plan Note (Signed)
Well controlled, no changes to meds. Encouraged heart healthy diet such as the DASH diet and exercise as tolerated.  °

## 2015-04-15 ENCOUNTER — Other Ambulatory Visit (INDEPENDENT_AMBULATORY_CARE_PROVIDER_SITE_OTHER): Payer: 59

## 2015-04-15 ENCOUNTER — Ambulatory Visit (INDEPENDENT_AMBULATORY_CARE_PROVIDER_SITE_OTHER): Payer: 59 | Admitting: Physician Assistant

## 2015-04-15 ENCOUNTER — Encounter: Payer: Self-pay | Admitting: Physician Assistant

## 2015-04-15 VITALS — BP 130/88 | HR 88 | Ht 75.0 in | Wt 312.2 lb

## 2015-04-15 DIAGNOSIS — R1011 Right upper quadrant pain: Secondary | ICD-10-CM

## 2015-04-15 DIAGNOSIS — R1013 Epigastric pain: Secondary | ICD-10-CM

## 2015-04-15 DIAGNOSIS — K219 Gastro-esophageal reflux disease without esophagitis: Secondary | ICD-10-CM

## 2015-04-15 LAB — SEDIMENTATION RATE: SED RATE: 2 mm/h (ref 0–22)

## 2015-04-15 LAB — HEPATIC FUNCTION PANEL
ALT: 185 U/L — ABNORMAL HIGH (ref 0–53)
AST: 139 U/L — ABNORMAL HIGH (ref 0–37)
Albumin: 4.4 g/dL (ref 3.5–5.2)
Alkaline Phosphatase: 75 U/L (ref 39–117)
BILIRUBIN DIRECT: 0.1 mg/dL (ref 0.0–0.3)
BILIRUBIN TOTAL: 0.8 mg/dL (ref 0.2–1.2)
TOTAL PROTEIN: 7.5 g/dL (ref 6.0–8.3)

## 2015-04-15 LAB — LIPASE: LIPASE: 11 U/L (ref 11.0–59.0)

## 2015-04-15 LAB — AMYLASE: AMYLASE: 61 U/L (ref 27–131)

## 2015-04-15 LAB — HIGH SENSITIVITY CRP: CRP, High Sensitivity: 5.92 mg/L — ABNORMAL HIGH (ref 0.000–5.000)

## 2015-04-15 MED ORDER — DICYCLOMINE HCL 10 MG PO CAPS: ORAL_CAPSULE | ORAL | Status: DC

## 2015-04-15 NOTE — Patient Instructions (Addendum)
Please go to the basement level to have your labs drawn.  We sent a prescription for Bentyl ( dicyclomine ) 10 mg. [facility] [address]. Take Protonix 40 mg , 1 tab every morning.  You have been scheduled for a HIDA scan at [facility] on Thursday 04-25-2015. Go to the [facility] off [address]. Entrance A. There is valet parking. Please arrive 15 minutes prior to your scheduled appointment at  9:00 AM. Make certain not to have anything to eat or drink at least 6 hours prior to your test. Should this appointment date or time not work well for you, please call radiology scheduling at [phone].  _____________________________________________________________________ hepatobiliary (HIDA) scan is an imaging procedure used to diagnose problems in the liver, gallbladder and bile ducts. In the HIDA scan, a radioactive chemical or tracer is injected into a vein in your arm. The tracer is handled by the liver like bile. Bile is a fluid produced and excreted by your liver that helps your digestive system break down fats in the foods you eat. Bile is stored in your gallbladder and the gallbladder releases the bile when you eat a meal. A special nuclear medicine scanner (gamma camera) tracks the flow of the tracer from your liver into your gallbladder and small intestine.  During your HIDA scan  You'll be asked to change into a hospital gown before your HIDA scan begins. Your health care team will position you on a table, usually on your back. The radioactive tracer is then injected into a vein in your arm.The tracer travels through your bloodstream to your liver, where it's taken up by the bile-producing cells. The radioactive tracer travels with the bile from your liver into your gallbladder and through your bile ducts to your small intestine.You may feel some pressure while the radioactive tracer is injected into your vein. As you lie on the table, a special gamma camera is positioned over your  abdomen taking pictures of the tracer as it moves through your body. The gamma camera takes pictures continually for about an hour. You'll need to keep still during the HIDA scan. This can become uncomfortable, but you may find that you can lessen the discomfort by taking deep breaths and thinking about other things. Tell your health care team if you're uncomfortable. The radiologist will watch on a computer the progress of the radioactive tracer through your body. The HIDA scan may be stopped when the radioactive tracer is seen in the gallbladder and enters your small intestine. This typically takes about an hour. In some cases extra imaging will be performed if original images aren't satisfactory, if morphine is given to help visualize the gallbladder or if the medication CCK is given to look at the contraction of the gallbladder. This test typically takes 2 hours to complete. ________________________________________________________________________
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
## 2015-04-18 ENCOUNTER — Ambulatory Visit: Payer: 59 | Admitting: Family Medicine

## 2015-04-19 ENCOUNTER — Other Ambulatory Visit: Payer: Self-pay

## 2015-04-19 DIAGNOSIS — R945 Abnormal results of liver function studies: Secondary | ICD-10-CM

## 2015-04-21 ENCOUNTER — Other Ambulatory Visit: Payer: Self-pay | Admitting: Family Medicine

## 2015-04-22 ENCOUNTER — Other Ambulatory Visit: Payer: Self-pay | Admitting: Family Medicine

## 2015-04-22 MED ORDER — LISDEXAMFETAMINE DIMESYLATE 50 MG PO CAPS: 50.0000 mg | ORAL_CAPSULE | Freq: Every day | ORAL | Status: DC

## 2015-04-24 ENCOUNTER — Other Ambulatory Visit (INDEPENDENT_AMBULATORY_CARE_PROVIDER_SITE_OTHER): Payer: 59

## 2015-04-24 DIAGNOSIS — K7689 Other specified diseases of liver: Secondary | ICD-10-CM | POA: Diagnosis not present

## 2015-04-24 DIAGNOSIS — R945 Abnormal results of liver function studies: Secondary | ICD-10-CM

## 2015-04-24 LAB — IBC PANEL
IRON: 81 ug/dL (ref 42–165)
SATURATION RATIOS: 21.2 % (ref 20.0–50.0)
TRANSFERRIN: 273 mg/dL (ref 212.0–360.0)

## 2015-04-25 ENCOUNTER — Ambulatory Visit (INDEPENDENT_AMBULATORY_CARE_PROVIDER_SITE_OTHER): Payer: 59 | Admitting: Family Medicine

## 2015-04-25 ENCOUNTER — Ambulatory Visit (HOSPITAL_COMMUNITY): Payer: 59

## 2015-04-25 ENCOUNTER — Encounter: Payer: Self-pay | Admitting: Family Medicine

## 2015-04-25 VITALS — BP 140/74 | HR 82 | Temp 97.5°F | Ht 75.0 in | Wt 308.4 lb

## 2015-04-25 DIAGNOSIS — J111 Influenza due to unidentified influenza virus with other respiratory manifestations: Secondary | ICD-10-CM | POA: Diagnosis not present

## 2015-04-25 DIAGNOSIS — I10 Essential (primary) hypertension: Secondary | ICD-10-CM | POA: Diagnosis not present

## 2015-04-25 LAB — IGG: IGG (IMMUNOGLOBIN G), SERUM: 1097 mg/dL (ref 700–1600)

## 2015-04-25 LAB — ANA: Anti Nuclear Antibody(ANA): NEGATIVE

## 2015-04-25 LAB — FERRITIN: Ferritin: 570 ng/mL — ABNORMAL HIGH (ref 30–400)

## 2015-04-25 LAB — ALPHA-1-ANTITRYPSIN: A-1 Antitrypsin: 129 mg/dL (ref 90–200)

## 2015-04-25 LAB — CERULOPLASMIN: Ceruloplasmin: 28.7 mg/dL (ref 16.0–31.0)

## 2015-04-25 LAB — HEPATITIS PANEL, ACUTE
HEP B C IGM: NEGATIVE
HEP B S AG: NEGATIVE
Hep A IgM: NEGATIVE
Hep C Virus Ab: 0.1 s/co ratio (ref 0.0–0.9)

## 2015-04-25 LAB — MITOCHONDRIAL ANTIBODIES: Mitochondrial Ab: 5.7 Units (ref 0.0–20.0)

## 2015-04-25 LAB — ANTI-SMOOTH MUSCLE ANTIBODY, IGG: SMOOTH MUSCLE AB: 6 U (ref 0–19)

## 2015-04-25 MED ORDER — OSELTAMIVIR PHOSPHATE 75 MG PO CAPS: 75.0000 mg | ORAL_CAPSULE | Freq: Two times a day (BID) | ORAL | Status: DC

## 2015-04-25 NOTE — Progress Notes (Signed)
Subjective:    Patient ID: Scott Pope, male    DOB: 1990/03/24, 25 y.o.   MRN: [id number]  Chief Complaint  Patient presents with  . Generalized Body Aches    sinus drainage, headache, fever x2 day    HPI Patient is in today for evaluation of 24 hours of fevers, chills, malaise and HA. Notes sore throat, congestion and ear pain. Has used some OTC meds to manage his symptoms. He works at [employer] and they have been seeing a great deal of flu. Denies CP/palp/SOB/HA/congestion/fevers/GI or GU c/o. Taking meds as prescribed  Past Medical History  Diagnosis Date  . Depression   . Overweight(278.02) 07/07/2010  . Acute low back pain 07/07/2010  . 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 ADHD (attention deficit hyperactivity disorder) 07/07/2010  . 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 GERD 01/14/2009  . ELEVATED BLOOD PRESSURE 01/14/2009  . Insomnia 08/04/2010  . Allergic state 09/04/2010  . Viral gastroenteritis 10/17/2010  . Otitis media of left ear 12/09/2010  . SOB (shortness of breath) on exertion 03/06/2011  . Skin lesion of left leg 04/01/2011  . Cough 04/06/2011  . Knee pain, left 09/24/2011  . Otitis media 11/24/2011  . Tick bite 10/17/2012  . Tinea corporis 11/22/2012  . Preventative health care 11/22/2012  . IBS (irritable bowel syndrome) 08/25/2013  . Benign essential HTN 01/14/2009    Qualifier: Diagnosis of  By: Caryl Never MD, Bruce    . Abdominal pain, epigastric 04/04/2015  . RUQ pain 04/04/2015    Past Surgical History  Procedure Laterality Date  . Thyroglossal duct cyst      excised at 25 years of age  . Tympanostomy tube placement      multiple   . Circumcision  AT 25YRS OLD    HAD WILD REACTION TO SEDATION MED GIVEN  . Tonsillectomy and adenoidectomy      Family History  Problem Relation Age of Onset  . Obesity Mother   . Depression Mother   . Fibromyalgia Mother   . Hypertension Mother   . Chronic fatigue Mother   . Hypertension Father   .  Fibromyalgia Maternal Grandmother   . Other Maternal Grandmother     reflux  . Other Maternal Grandfather     Lems disease  . Hypertension Paternal Grandfather   . Arthritis Paternal Grandfather   . Colon cancer Neg Hx   . Esophageal cancer Neg Hx   . Stomach cancer Neg Hx   . Pancreatic cancer Neg Hx   . Gallbladder disease      unknown  . Colon polyps Neg Hx     Social History   Social History  . Marital Status: Single    Spouse Name: N/A  . Number of Children: N/A  . Years of Education: N/A   Occupational History  . Not on file.   Social History Main Topics  . Smoking status: Never Smoker   . Smokeless tobacco: Never Used  . Alcohol Use: No  . Drug Use: No  . Sexual Activity: No   Other Topics Concern  . Not on file   Social History Narrative    Outpatient Prescriptions Prior to Visit  Medication Sig Dispense Refill  . ALPRAZolam (XANAX) 0.25 MG tablet Take 1 tablet (0.25 mg total) by mouth 2 (two) times daily as needed for anxiety. 10 tablet 0  . dicyclomine (BENTYL) 10 MG capsule Take 1 tab 30 min before meals. 90 capsule 1  . escitalopram (  LEXAPRO) 10 MG tablet Take 1 tablet (10 mg total) by mouth daily. 30 tablet 2  . lisdexamfetamine (VYVANSE) 50 MG capsule Take 1 capsule (50 mg total) by mouth daily. March. 2017 30 capsule 0  . metoprolol succinate (TOPROL-XL) 50 MG 24 hr tablet Take 1 tablet (50 mg total) by mouth daily. Take with or immediately following a meal. 30 tablet 3  . pantoprazole (PROTONIX) 40 MG tablet TAKE 1 TABLET (40 MG TOTAL) BY MOUTH DAILY. 30 tablet 2   No facility-administered medications prior to visit.    Allergies  Allergen Reactions  . Strattera [Atomoxetine Hcl]     Suicidal thoughts, depression    Review of Systems  Constitutional: Positive for fever, chills and malaise/fatigue.  HENT: Positive for congestion, ear pain and sore throat.   Eyes: Negative for blurred vision.  Respiratory: Positive for cough. Negative for  shortness of breath.   Cardiovascular: Negative for chest pain, palpitations and leg swelling.  Gastrointestinal: Negative for nausea, abdominal pain and blood in stool.  Genitourinary: Negative for dysuria and frequency.  Musculoskeletal: Positive for myalgias and back pain. Negative for falls.  Skin: Negative for rash.  Neurological: Positive for headaches. Negative for dizziness and loss of consciousness.  Endo/Heme/Allergies: Negative for environmental allergies.  Psychiatric/Behavioral: Negative for depression. The patient is not nervous/anxious.        Objective:    Physical Exam  Constitutional: He is oriented to person, place, and time. He appears well-developed and well-nourished. No distress.  HENT:  Head: Normocephalic and atraumatic.  Nose: Nose normal.  Oropharynx erythematous. TMs retracted and dull.   Eyes: Right eye exhibits no discharge. Left eye exhibits no discharge.  Neck: Normal range of motion. Neck supple.  Cardiovascular: Normal rate and regular rhythm.   No murmur heard. Pulmonary/Chest: Effort normal and breath sounds normal.  Abdominal: Soft. Bowel sounds are normal. There is no tenderness.  Musculoskeletal: He exhibits no edema.  Lymphadenopathy:    He has cervical adenopathy.  Neurological: He is alert and oriented to person, place, and time.  Skin: Skin is warm and dry.  Psychiatric: He has a normal mood and affect.  Nursing note and vitals reviewed.   BP 140/74 mmHg  Pulse 82  Temp(Src) 97.5 F (36.4 C) (Oral)  Ht  (1.905 m)  Wt 308 lb 6.4 oz (139.889 kg)  BMI 38.55 kg/m2  SpO2 98% Wt Readings from Last 3 Encounters:  04/25/15 308 lb 6.4 oz (139.889 kg)  04/15/15 312 lb 3.2 oz (141.613 kg)  04/04/15 312 lb 2 oz (141.579 kg)     Lab Results  Component Value Date   WBC 8.1 03/20/2015   HGB 17.1* 03/20/2015   HCT 50.5 03/20/2015   PLT 308.0 03/20/2015   GLUCOSE 103* 03/20/2015   CHOL 160 03/20/2015   TRIG 185.0* 03/20/2015    HDL 39.60 03/20/2015   LDLCALC 84 03/20/2015   ALT 185* 04/15/2015   AST 139* 04/15/2015   NA 139 03/20/2015   K 4.3 03/20/2015   CL 107 03/20/2015   CREATININE 1.10 03/20/2015   BUN 14 03/20/2015   CO2 23 03/20/2015   TSH 1.27 03/20/2015    Lab Results  Component Value Date   TSH 1.27 03/20/2015   Lab Results  Component Value Date   WBC 8.1 03/20/2015   HGB 17.1* 03/20/2015   HCT 50.5 03/20/2015   MCV 87.4 03/20/2015   PLT 308.0 03/20/2015   Lab Results  Component Value Date   NA  139 03/20/2015   K 4.3 03/20/2015   CO2 23 03/20/2015   GLUCOSE 103* 03/20/2015   BUN 14 03/20/2015   CREATININE 1.10 03/20/2015   BILITOT 0.8 04/15/2015   ALKPHOS 75 04/15/2015   AST 139* 04/15/2015   ALT 185* 04/15/2015   PROT 7.5 04/15/2015   ALBUMIN 4.4 04/15/2015   CALCIUM 9.5 03/20/2015   GFR 88.57 03/20/2015   Lab Results  Component Value Date   CHOL 160 03/20/2015   Lab Results  Component Value Date   HDL 39.60 03/20/2015   Lab Results  Component Value Date   LDLCALC 84 03/20/2015   Lab Results  Component Value Date   TRIG 185.0* 03/20/2015   Lab Results  Component Value Date   CHOLHDL 4 03/20/2015   No results found for: HGBA1C     Assessment & Plan:   Problem List Items Addressed This Visit    Benign essential HTN - Primary    Well controlled, no changes to meds. Encouraged heart healthy diet such as the DASH diet and exercise as tolerated.        Other Visit Diagnoses    Influenza        Relevant Medications    oseltamivir (TAMIFLU) 75 MG capsule     encouraged rest and adequate hydration, kept out of work til 3/13.  I am having Mr. Kistler start on oseltamivir. I am also having him maintain his pantoprazole, metoprolol succinate, escitalopram, ALPRAZolam, dicyclomine, and lisdexamfetamine.  Meds ordered this encounter  Medications  . oseltamivir (TAMIFLU) 75 MG capsule    Sig: Take 1 capsule (75 mg total) by mouth 2 (two) times daily.     Dispense:  10 capsule    Refill:  0     Danise EdgeBLYTH, Journey Castonguay, MD

## 2015-04-25 NOTE — Patient Instructions (Signed)

## 2015-04-25 NOTE — Assessment & Plan Note (Signed)
Well controlled, no changes to meds. Encouraged heart healthy diet such as the DASH diet and exercise as tolerated.  °

## 2015-05-01 ENCOUNTER — Ambulatory Visit (HOSPITAL_COMMUNITY)
Admission: RE | Admit: 2015-05-01 | Discharge: 2015-05-01 | Disposition: A | Discharge status: Home / Self Care | Payer: 59 | Source: Ambulatory Visit | Attending: Physician Assistant | Admitting: Physician Assistant

## 2015-05-01 ENCOUNTER — Other Ambulatory Visit: Payer: 59

## 2015-05-01 DIAGNOSIS — R1011 Right upper quadrant pain: Secondary | ICD-10-CM | POA: Insufficient documentation

## 2015-05-01 DIAGNOSIS — R1013 Epigastric pain: Secondary | ICD-10-CM | POA: Diagnosis present

## 2015-05-01 DIAGNOSIS — K219 Gastro-esophageal reflux disease without esophagitis: Secondary | ICD-10-CM | POA: Diagnosis not present

## 2015-05-01 MED ORDER — TECHNETIUM TC 99M MEBROFENIN IV KIT
5.0000 | PACK | Freq: Once | INTRAVENOUS | Status: AC | PRN
  Administered 2015-05-01: 5 via INTRAVENOUS

## 2015-05-02 ENCOUNTER — Telehealth: Payer: Self-pay | Admitting: Physician Assistant

## 2015-05-02 NOTE — Telephone Encounter (Signed)
Spoke with the mother, Clydie BraunKaren. She is aware of the previous labs. She was looking for the results from the hemachromatosis lab which is not in yet. Also advised her of the normal HIDA CCK.

## 2015-05-06 ENCOUNTER — Telehealth: Payer: Self-pay | Admitting: Physician Assistant

## 2015-05-06 NOTE — Telephone Encounter (Signed)
Patient had normal HIDA CCK. He is awaiting the results from the hemachromatosis testing. Mom calls today. The patient is having spells of "doubling over pain and staying up all night". He has tylenol, muscle relaxers,  Xanax and the GI meds. None of these help. He vomited 3 times on Saturday. Mom says he is tired a lot, but he went to work today. The lab test is not back yet.

## 2015-05-08 NOTE — Telephone Encounter (Signed)
Pt's mother is calling back about the pt.

## 2015-05-09 ENCOUNTER — Other Ambulatory Visit: Payer: Self-pay

## 2015-05-09 ENCOUNTER — Other Ambulatory Visit: Payer: Self-pay | Admitting: Family Medicine

## 2015-05-09 ENCOUNTER — Telehealth: Payer: Self-pay | Admitting: Family Medicine

## 2015-05-09 DIAGNOSIS — R109 Unspecified abdominal pain: Secondary | ICD-10-CM

## 2015-05-09 DIAGNOSIS — R748 Abnormal levels of other serum enzymes: Secondary | ICD-10-CM

## 2015-05-09 DIAGNOSIS — R7989 Other specified abnormal findings of blood chemistry: Secondary | ICD-10-CM

## 2015-05-09 LAB — HEMOCHROMATOSIS DNA-PCR(C282Y,H63D)

## 2015-05-09 NOTE — Telephone Encounter (Signed)
Mother advised of the lab results for hemachromatosis. She will encourage a healthier diet, avoid OTC and herbal medications and abstain from alcohol. Keep appointment. Labs in June.

## 2015-05-09 NOTE — Telephone Encounter (Signed)
Caller name: Boyce MediciKaren Buhl Relationship to patient: mother Can be reached: [phone]  Reason for call: Pt has been to GI and cardiology and they are referring back to PCP for stomach pains. They said it's not gall bladder and he is taking meds for anxiety. She states he is having a terrible time. She said that he is throwing up multiple times a day 3 or more days a week. First available appt with Dr. Abner GreenspanBlyth 05/21/15. She is wanting to know if they can get in sooner with Dr. Abner GreenspanBlyth or if they should see Selena BattenCody for something to provide relief.

## 2015-05-09 NOTE — Telephone Encounter (Signed)
OK I will order him a CT scan of Abdomen to further investigate. Please prescribe him Phenergan 25 mg tabs, 1 tab po q 8 hours prn nausea or vomiting, warn them this will make him sleepy, disp #30 with 1 rf. He should be seen within the week.

## 2015-05-10 MED ORDER — PROMETHAZINE HCL 25 MG PO TABS: 25.0000 mg | ORAL_TABLET | Freq: Three times a day (TID) | ORAL | Status: DC | PRN

## 2015-05-10 NOTE — Telephone Encounter (Signed)
Sent in prescription and mom aware. Appt. Taken care of.

## 2015-05-21 ENCOUNTER — Ambulatory Visit: Payer: 59 | Admitting: Family Medicine

## 2015-05-22 ENCOUNTER — Ambulatory Visit (HOSPITAL_BASED_OUTPATIENT_CLINIC_OR_DEPARTMENT_OTHER)
Admission: RE | Admit: 2015-05-22 | Discharge: 2015-05-22 | Disposition: A | Discharge status: Home / Self Care | Payer: 59 | Source: Ambulatory Visit | Attending: Family Medicine | Admitting: Family Medicine

## 2015-05-22 DIAGNOSIS — R109 Unspecified abdominal pain: Secondary | ICD-10-CM | POA: Insufficient documentation

## 2015-05-22 DIAGNOSIS — K5730 Diverticulosis of large intestine without perforation or abscess without bleeding: Secondary | ICD-10-CM | POA: Diagnosis not present

## 2015-05-22 MED ORDER — IOPAMIDOL (ISOVUE-300) INJECTION 61%
100.0000 mL | Freq: Once | INTRAVENOUS | Status: AC | PRN
  Administered 2015-05-22: 100 mL via INTRAVENOUS

## 2015-06-06 ENCOUNTER — Encounter: Payer: Self-pay | Admitting: Family Medicine

## 2015-06-06 ENCOUNTER — Ambulatory Visit (INDEPENDENT_AMBULATORY_CARE_PROVIDER_SITE_OTHER): Payer: 59 | Admitting: Family Medicine

## 2015-06-06 VITALS — BP 132/84 | HR 94 | Temp 98.1°F | Ht 75.0 in | Wt 313.5 lb

## 2015-06-06 DIAGNOSIS — I10 Essential (primary) hypertension: Secondary | ICD-10-CM | POA: Diagnosis not present

## 2015-06-06 DIAGNOSIS — F909 Attention-deficit hyperactivity disorder, unspecified type: Secondary | ICD-10-CM

## 2015-06-06 DIAGNOSIS — T7840XD Allergy, unspecified, subsequent encounter: Secondary | ICD-10-CM

## 2015-06-06 DIAGNOSIS — K219 Gastro-esophageal reflux disease without esophagitis: Secondary | ICD-10-CM

## 2015-06-06 DIAGNOSIS — F411 Generalized anxiety disorder: Secondary | ICD-10-CM

## 2015-06-06 DIAGNOSIS — E663 Overweight: Secondary | ICD-10-CM

## 2015-06-06 NOTE — Progress Notes (Signed)
Subjective:    Patient ID: Scott Pope, male    DOB: 01-22-1991, 25 y.o.   MRN: [id number]  Chief Complaint  Patient presents with  . Follow-up    HPI Patient is in today for follow up. He is feeling much better secondary to dietary changes. He has decreased fast food and given up sodas. No recent illness ofr acute concerns. Bowels are moving well and abominal pain has improved. Denies CP/palp/SOB/HA/congestion/fevers/GI or GU c/o. Taking meds as prescribed  Past Medical History  Diagnosis Date  . Depression   . Overweight(278.02) 07/07/2010  . Acute low back pain 07/07/2010  . 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 ADHD (attention deficit hyperactivity disorder) 07/07/2010  . 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 GERD 01/14/2009  . ELEVATED BLOOD PRESSURE 01/14/2009  . Insomnia 08/04/2010  . Allergic state 09/04/2010  . Viral gastroenteritis 10/17/2010  . Otitis media of left ear 12/09/2010  . SOB (shortness of breath) on exertion 03/06/2011  . Skin lesion of left leg 04/01/2011  . Cough 04/06/2011  . Knee pain, left 09/24/2011  . Otitis media 11/24/2011  . Tick bite 10/17/2012  . Tinea corporis 11/22/2012  . Preventative health care 11/22/2012  . IBS (irritable bowel syndrome) 08/25/2013  . Benign essential HTN 01/14/2009    Qualifier: Diagnosis of  By: Caryl Never MD, Bruce    . Abdominal pain, epigastric 04/04/2015  . RUQ pain 04/04/2015    Past Surgical History  Procedure Laterality Date  . Thyroglossal duct cyst      excised at 25 years of age  . Tympanostomy tube placement      multiple   . Circumcision  AT 25YRS OLD    HAD WILD REACTION TO SEDATION MED GIVEN  . Tonsillectomy and adenoidectomy      Family History  Problem Relation Age of Onset  . Obesity Mother   . Depression Mother   . Fibromyalgia Mother   . Hypertension Mother   . Chronic fatigue Mother   . Hypertension Father   . Fibromyalgia Maternal Grandmother   . Other Maternal Grandmother     reflux  .  Other Maternal Grandfather     Lems disease  . Hypertension Paternal Grandfather   . Arthritis Paternal Grandfather   . Colon cancer Neg Hx   . Esophageal cancer Neg Hx   . Stomach cancer Neg Hx   . Pancreatic cancer Neg Hx   . Gallbladder disease      unknown  . Colon polyps Neg Hx     Social History   Social History  . Marital Status: Single    Spouse Name: N/A  . Number of Children: N/A  . Years of Education: N/A   Occupational History  . Not on file.   Social History Main Topics  . Smoking status: Never Smoker   . Smokeless tobacco: Never Used  . Alcohol Use: No  . Drug Use: No  . Sexual Activity: No   Other Topics Concern  . Not on file   Social History Narrative    Outpatient Prescriptions Prior to Visit  Medication Sig Dispense Refill  . ALPRAZolam (XANAX) 0.25 MG tablet Take 1 tablet (0.25 mg total) by mouth 2 (two) times daily as needed for anxiety. 10 tablet 0  . dicyclomine (BENTYL) 10 MG capsule Take 1 tab 30 min before meals. 90 capsule 1  . escitalopram (LEXAPRO) 10 MG tablet Take 1 tablet (10 mg total) by mouth daily. 30 tablet 2  .  lisdexamfetamine (VYVANSE) 50 MG capsule Take 1 capsule (50 mg total) by mouth daily. March. 2017 30 capsule 0  . metoprolol succinate (TOPROL-XL) 50 MG 24 hr tablet Take 1 tablet (50 mg total) by mouth daily. Take with or immediately following a meal. 30 tablet 3  . pantoprazole (PROTONIX) 40 MG tablet TAKE 1 TABLET (40 MG TOTAL) BY MOUTH DAILY. 30 tablet 2  . promethazine (PHENERGAN) 25 MG tablet Take 1 tablet (25 mg total) by mouth every 8 (eight) hours as needed for nausea or vomiting. 30 tablet 1  . oseltamivir (TAMIFLU) 75 MG capsule Take 1 capsule (75 mg total) by mouth 2 (two) times daily. 10 capsule 0   No facility-administered medications prior to visit.    Allergies  Allergen Reactions  . Strattera [Atomoxetine Hcl]     Suicidal thoughts, depression    ROS     Objective:    Physical Exam    Constitutional: He is oriented to person, place, and time. He appears well-developed and well-nourished. No distress.  HENT:  Head: Normocephalic and atraumatic.  Eyes: Conjunctivae are normal.  Neck: Neck supple. No thyromegaly present.  Cardiovascular: Normal rate, regular rhythm and normal heart sounds.   No murmur heard. Pulmonary/Chest: Effort normal and breath sounds normal. No respiratory distress. He has no wheezes.  Abdominal: Soft. Bowel sounds are normal. He exhibits no mass. There is no tenderness.  Musculoskeletal: He exhibits no edema.  Lymphadenopathy:    He has no cervical adenopathy.  Neurological: He is alert and oriented to person, place, and time.  Skin: Skin is warm and dry.  Psychiatric: He has a normal mood and affect. His behavior is normal.    BP 132/84 mmHg  Pulse 94  Temp(Src) 98.1 F (36.7 C) (Oral)  Ht  (1.905 m)  Wt 313 lb 8 oz (142.203 kg)  BMI 39.18 kg/m2  SpO2 96% Wt Readings from Last 3 Encounters:  06/06/15 313 lb 8 oz (142.203 kg)  04/25/15 308 lb 6.4 oz (139.889 kg)  04/15/15 312 lb 3.2 oz (141.613 kg)     Lab Results  Component Value Date   WBC 8.1 03/20/2015   HGB 17.1* 03/20/2015   HCT 50.5 03/20/2015   PLT 308.0 03/20/2015   GLUCOSE 103* 03/20/2015   CHOL 160 03/20/2015   TRIG 185.0* 03/20/2015   HDL 39.60 03/20/2015   LDLCALC 84 03/20/2015   ALT 185* 04/15/2015   AST 139* 04/15/2015   NA 139 03/20/2015   K 4.3 03/20/2015   CL 107 03/20/2015   CREATININE 1.10 03/20/2015   BUN 14 03/20/2015   CO2 23 03/20/2015   TSH 1.27 03/20/2015    Lab Results  Component Value Date   TSH 1.27 03/20/2015   Lab Results  Component Value Date   WBC 8.1 03/20/2015   HGB 17.1* 03/20/2015   HCT 50.5 03/20/2015   MCV 87.4 03/20/2015   PLT 308.0 03/20/2015   Lab Results  Component Value Date   NA 139 03/20/2015   K 4.3 03/20/2015   CO2 23 03/20/2015   GLUCOSE 103* 03/20/2015   BUN 14 03/20/2015   CREATININE 1.10  03/20/2015   BILITOT 0.8 04/15/2015   ALKPHOS 75 04/15/2015   AST 139* 04/15/2015   ALT 185* 04/15/2015   PROT 7.5 04/15/2015   ALBUMIN 4.4 04/15/2015   CALCIUM 9.5 03/20/2015   GFR 88.57 03/20/2015   Lab Results  Component Value Date   CHOL 160 03/20/2015   Lab Results  Component  Value Date   HDL 39.60 03/20/2015   Lab Results  Component Value Date   LDLCALC 84 03/20/2015   Lab Results  Component Value Date   TRIG 185.0* 03/20/2015   Lab Results  Component Value Date   CHOLHDL 4 03/20/2015   No results found for: HGBA1C     Assessment & Plan:   Problem List Items Addressed This Visit    ADHD (attention deficit hyperactivity disorder)   Allergic state    May use Zyrtec prn for seasonal allergies.      Anxiety state    Doing much better at the current time.on Escitalopram. No new concerns.      Benign essential HTN - Primary    Well controlled, no changes to meds. Encouraged heart healthy diet such as the DASH diet and exercise as tolerated.       GERD    Avoid offending foods, start probiotics. Do not eat large meals in late evening and consider raising head of bed.          I have discontinued Mr. Thieme's oseltamivir. I am also having him maintain his pantoprazole, metoprolol succinate, escitalopram, ALPRAZolam, dicyclomine, lisdexamfetamine, and promethazine.  No orders of the defined types were placed in this encounter.     Danise EdgeBLYTH, STACEY, MD

## 2015-06-06 NOTE — Assessment & Plan Note (Signed)
Well controlled, no changes to meds. Encouraged heart healthy diet such as the DASH diet and exercise as tolerated.  °

## 2015-06-06 NOTE — Assessment & Plan Note (Signed)
Avoid offending foods, start probiotics. Do not eat large meals in late evening and consider raising head of bed.  

## 2015-06-06 NOTE — Assessment & Plan Note (Signed)
Doing much better at the current time.on Escitalopram. No new concerns.

## 2015-06-06 NOTE — Patient Instructions (Addendum)
DASH Eating Plan  DASH stands for "Dietary Approaches to Stop Hypertension." The DASH eating plan is a healthy eating plan that has been shown to reduce high blood pressure (hypertension). Additional health benefits may include reducing the risk of type 2 diabetes mellitus, heart disease, and stroke. The DASH eating plan may also help with weight loss.  WHAT DO I NEED TO KNOW ABOUT THE DASH EATING PLAN?  For the DASH eating plan, you will follow these general guidelines:  · Choose foods with a percent daily value for sodium of less than 5% (as listed on the food label).  · Use salt-free seasonings or herbs instead of table salt or sea salt.  · Check with your health care provider or pharmacist before using salt substitutes.  · Eat lower-sodium products, often labeled as "lower sodium" or "no salt added."  · Eat fresh foods.  · Eat more vegetables, fruits, and low-fat dairy products.  · Choose whole grains. Look for the word "whole" as the first word in the ingredient list.  · Choose fish and skinless chicken or turkey more often than red meat. Limit fish, poultry, and meat to 6 oz (170 g) each day.  · Limit sweets, desserts, sugars, and sugary drinks.  · Choose heart-healthy fats.  · Limit cheese to 1 oz (28 g) per day.  · Eat more home-cooked food and less restaurant, buffet, and fast food.  · Limit fried foods.  · Cook foods using methods other than frying.  · Limit canned vegetables. If you do use them, rinse them well to decrease the sodium.  · When eating at a restaurant, ask that your food be prepared with less salt, or no salt if possible.  WHAT FOODS CAN I EAT?  Seek help from a dietitian for individual calorie needs.  Grains  Whole grain or whole wheat bread. Brown rice. Whole grain or whole wheat pasta. Quinoa, bulgur, and whole grain cereals. Low-sodium cereals. Corn or whole wheat flour tortillas. Whole grain cornbread. Whole grain crackers. Low-sodium crackers.  Vegetables  Fresh or frozen vegetables  (raw, steamed, roasted, or grilled). Low-sodium or reduced-sodium tomato and vegetable juices. Low-sodium or reduced-sodium tomato sauce and paste. Low-sodium or reduced-sodium canned vegetables.   Fruits  All fresh, canned (in natural juice), or frozen fruits.  Meat and Other Protein Products  Ground beef (85% or leaner), grass-fed beef, or beef trimmed of fat. Skinless chicken or turkey. Ground chicken or turkey. Pork trimmed of fat. All fish and seafood. Eggs. Dried beans, peas, or lentils. Unsalted nuts and seeds. Unsalted canned beans.  Dairy  Low-fat dairy products, such as skim or 1% milk, 2% or reduced-fat cheeses, low-fat ricotta or cottage cheese, or plain low-fat yogurt. Low-sodium or reduced-sodium cheeses.  Fats and Oils  Tub margarines without trans fats. Light or reduced-fat mayonnaise and salad dressings (reduced sodium). Avocado. Safflower, olive, or canola oils. Natural peanut or almond butter.  Other  Unsalted popcorn and pretzels.  The items listed above may not be a complete list of recommended foods or beverages. Contact your dietitian for more options.  WHAT FOODS ARE NOT RECOMMENDED?  Grains  White bread. White pasta. White rice. Refined cornbread. Bagels and croissants. Crackers that contain trans fat.  Vegetables  Creamed or fried vegetables. Vegetables in a cheese sauce. Regular canned vegetables. Regular canned tomato sauce and paste. Regular tomato and vegetable juices.  Fruits  Dried fruits. Canned fruit in light or heavy syrup. Fruit juice.  Meat and Other Protein   Products  Fatty cuts of meat. Ribs, chicken wings, bacon, sausage, bologna, salami, chitterlings, fatback, hot dogs, bratwurst, and packaged luncheon meats. Salted nuts and seeds. Canned beans with salt.  Dairy  Whole or 2% milk, cream, half-and-half, and cream cheese. Whole-fat or sweetened yogurt. Full-fat cheeses or blue cheese. Nondairy creamers and whipped toppings. Processed cheese, cheese spreads, or cheese  curds.  Condiments  Onion and garlic salt, seasoned salt, table salt, and sea salt. Canned and packaged gravies. Worcestershire sauce. Tartar sauce. Barbecue sauce. Teriyaki sauce. Soy sauce, including reduced sodium. Steak sauce. Fish sauce. Oyster sauce. Cocktail sauce. Horseradish. Ketchup and mustard. Meat flavorings and tenderizers. Bouillon cubes. Hot sauce. Tabasco sauce. Marinades. Taco seasonings. Relishes.  Fats and Oils  Butter, stick margarine, lard, shortening, ghee, and bacon fat. Coconut, palm kernel, or palm oils. Regular salad dressings.  Other  Pickles and olives. Salted popcorn and pretzels.  The items listed above may not be a complete list of foods and beverages to avoid. Contact your dietitian for more information.  WHERE CAN I FIND MORE INFORMATION?  National Heart, Lung, and [employer] Institute: www.nhlbi.nih.gov/health/health-topics/topics/dash/     This information is not intended to replace advice given to you by your health care provider. Make sure you discuss any questions you have with your health care provider.     Document Released: 01/22/2011 Document Revised: 02/23/2014 Document Reviewed: 12/07/2012  Elsevier Interactive Patient Education ©2016 [employer]

## 2015-06-06 NOTE — Progress Notes (Signed)
Pre visit review using our clinic review tool, if applicable. No additional management support is needed unless otherwise documented below in the visit note. 

## 2015-06-06 NOTE — Assessment & Plan Note (Signed)
Encouraged DASH diet, decrease po intake and increase exercise as tolerated. Needs 7-8 hours of sleep nightly. Avoid trans fats, eat small, frequent meals every 4-5 hours with lean proteins, complex carbs and healthy fats. Minimize simple carbs, 

## 2015-06-06 NOTE — Assessment & Plan Note (Signed)
May use Zyrtec prn for seasonal allergies.

## 2015-07-08 ENCOUNTER — Ambulatory Visit (INDEPENDENT_AMBULATORY_CARE_PROVIDER_SITE_OTHER): Payer: 59 | Admitting: Medical

## 2015-07-08 ENCOUNTER — Encounter: Payer: Self-pay | Admitting: Medical

## 2015-07-08 VITALS — BP 130/86 | HR 90 | Temp 98.0°F | Ht 75.0 in | Wt 311.0 lb

## 2015-07-08 DIAGNOSIS — T148 Other injury of unspecified body region: Secondary | ICD-10-CM | POA: Diagnosis not present

## 2015-07-08 DIAGNOSIS — L089 Local infection of the skin and subcutaneous tissue, unspecified: Secondary | ICD-10-CM | POA: Diagnosis not present

## 2015-07-08 DIAGNOSIS — W57XXXA Bitten or stung by nonvenomous insect and other nonvenomous arthropods, initial encounter: Secondary | ICD-10-CM | POA: Diagnosis not present

## 2015-07-08 MED ORDER — DOXYCYCLINE HYCLATE 100 MG PO TABS: 100.0000 mg | ORAL_TABLET | Freq: Two times a day (BID) | ORAL | Status: DC

## 2015-07-08 NOTE — Patient Instructions (Addendum)
For skin infection will rx doxycycline. Rx advisement given.  Will go ahead and get tick bite studies.  If area that is red worsens or changes let us know.  Follow up in 10 days or as needed

## 2015-07-08 NOTE — Progress Notes (Signed)
Pre visit review using our clinic review tool, if applicable. No additional management support is needed unless otherwise documented below in the visit note. 

## 2015-07-08 NOTE — Progress Notes (Signed)
Subjective:    Patient ID: Scott Pope, male    DOB: 09/19/1990, 25 y.o.   MRN: [id number]  HPI  Pt in for evaluation of knot in his groin area. Pt states he saw not in his groin area 3-4 days ago. But he also states he saw a pimple in area/white head. He states pimple  was not that large.   Pt does occaisonally see ticks crawl on him. May 6 pulled off that were crawlin on him  in last 2 months. He pulled off two ticks about 2 weeks ago.    Review of Systems  Constitutional: Negative for fever, chills and fatigue.  Respiratory: Negative for cough, chest tightness, shortness of breath and wheezing.   Cardiovascular: Negative for chest pain and palpitations.  Gastrointestinal: Negative for abdominal pain.  Skin: Positive for rash.       See physical exam location  Neurological: Negative for dizziness, numbness and headaches.  Hematological: Negative for adenopathy. Does not bruise/bleed easily.  Psychiatric/Behavioral: Negative for behavioral problems and confusion.    Past Medical History  Diagnosis Date  . Depression   . Overweight(278.02) 07/07/2010  . Acute low back pain 07/07/2010  . 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 ADHD (attention deficit hyperactivity disorder) 07/07/2010  . 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 GERD 01/14/2009  . ELEVATED BLOOD PRESSURE 01/14/2009  . Insomnia 08/04/2010  . Allergic state 09/04/2010  . Viral gastroenteritis 10/17/2010  . Otitis media of left ear 12/09/2010  . SOB (shortness of breath) on exertion 03/06/2011  . Skin lesion of left leg 04/01/2011  . Cough 04/06/2011  . Knee pain, left 09/24/2011  . Otitis media 11/24/2011  . Tick bite 10/17/2012  . Tinea corporis 11/22/2012  . Preventative health care 11/22/2012  . IBS (irritable bowel syndrome) 08/25/2013  . Benign essential HTN 01/14/2009    Qualifier: Diagnosis of  By: Caryl NeverBurchette MD, Bruce    . Abdominal pain, epigastric 04/04/2015  . RUQ pain 04/04/2015     Social History   Social  History  . Marital Status: Single    Spouse Name: N/A  . Number of Children: N/A  . Years of Education: N/A   Occupational History  . Not on file.   Social History Main Topics  . Smoking status: Never Smoker   . Smokeless tobacco: Never Used  . Alcohol Use: No  . Drug Use: No  . Sexual Activity: No   Other Topics Concern  . Not on file   Social History Narrative    Past Surgical History  Procedure Laterality Date  . Thyroglossal duct cyst      excised at 25 years of age  . Tympanostomy tube placement      multiple   . Circumcision  AT 25YRS OLD    HAD WILD REACTION TO SEDATION MED GIVEN  . Tonsillectomy and adenoidectomy      Family History  Problem Relation Age of Onset  . Obesity Mother   . Depression Mother   . Fibromyalgia Mother   . Hypertension Mother   . Chronic fatigue Mother   . Hypertension Father   . Fibromyalgia Maternal Grandmother   . Other Maternal Grandmother     reflux  . Other Maternal Grandfather     Lems disease  . Hypertension Paternal Grandfather   . Arthritis Paternal Grandfather   . Colon cancer Neg Hx   . Esophageal cancer Neg Hx   . Stomach cancer Neg Hx   . Pancreatic  cancer Neg Hx   . Gallbladder disease      unknown  . Colon polyps Neg Hx     Allergies  Allergen Reactions  . Strattera [Atomoxetine Hcl]     Suicidal thoughts, depression    Current Outpatient Prescriptions on File Prior to Visit  Medication Sig Dispense Refill  . ALPRAZolam (XANAX) 0.25 MG tablet Take 1 tablet (0.25 mg total) by mouth 2 (two) times daily as needed for anxiety. 10 tablet 0  . dicyclomine (BENTYL) 10 MG capsule Take 1 tab 30 min before meals. 90 capsule 1  . escitalopram (LEXAPRO) 10 MG tablet Take 1 tablet (10 mg total) by mouth daily. 30 tablet 2  . lisdexamfetamine (VYVANSE) 50 MG capsule Take 1 capsule (50 mg total) by mouth daily. March. 2017 30 capsule 0  . metoprolol succinate (TOPROL-XL) 50 MG 24 hr tablet Take 1 tablet (50 mg total)  by mouth daily. Take with or immediately following a meal. 30 tablet 3  . pantoprazole (PROTONIX) 40 MG tablet TAKE 1 TABLET (40 MG TOTAL) BY MOUTH DAILY. 30 tablet 2  . promethazine (PHENERGAN) 25 MG tablet Take 1 tablet (25 mg total) by mouth every 8 (eight) hours as needed for nausea or vomiting. 30 tablet 1   No current facility-administered medications on file prior to visit.    BP 130/86 mmHg  Pulse 90  Temp(Src) 98 F (36.7 C) (Oral)  Ht  (1.905 m)  Wt 311 lb (141.069 kg)  BMI 38.87 kg/m2  SpO2 98%       Objective:   Physical Exam  General- No acute distress. Pleasant patient. Neck- Full range of motion, no jvd Lungs- Clear, even and unlabored. Heart- regular rate and rhythm. Neurologic- CNII- XII grossly intact.  Skin- rt upper groin area. On surface of skin inguinal fold 1 cm of faint red skin, indurated and tender. No definite lymph node. No ticks seen.       Assessment & Plan:  For skin infection will rx doxycycline. Rx advisement given.  Will go ahead and get tick bite studies.  If area that is red worsens or changes let us know.  Follow up in 10 days or as needed

## 2015-07-09 ENCOUNTER — Other Ambulatory Visit: Payer: Self-pay | Admitting: Family Medicine

## 2015-07-09 ENCOUNTER — Ambulatory Visit: Payer: 59 | Admitting: Internal Medicine

## 2015-07-09 ENCOUNTER — Telehealth: Payer: Self-pay | Admitting: *Deleted

## 2015-07-09 LAB — LYME AB/WESTERN BLOT REFLEX: B burgdorferi Ab IgG+IgM: <0.9 Index (ref <0.90)

## 2015-07-09 LAB — ROCKY MTN SPOTTED FVR ABS PNL(IGG+IGM)
RMSF IGG: NOT DETECTED
RMSF IGM: NOT DETECTED

## 2015-07-09 NOTE — Telephone Encounter (Signed)
No show letter sent to patient

## 2015-07-10 LAB — LYME ABY, WSTRN BLT IGG & IGM W/BANDS
B BURGDORFERI IGM ABS (IB): NEGATIVE
B burgdorferi IgG Abs (IB): NEGATIVE
LYME DISEASE 18 KD IGG: NONREACTIVE
LYME DISEASE 23 KD IGM: NONREACTIVE
LYME DISEASE 39 KD IGM: NONREACTIVE
LYME DISEASE 41 KD IGM: NONREACTIVE
LYME DISEASE 45 KD IGG: NONREACTIVE
LYME DISEASE 58 KD IGG: NONREACTIVE
Lyme Disease 23 kD IgG: NONREACTIVE
Lyme Disease 28 kD IgG: NONREACTIVE
Lyme Disease 30 kD IgG: NONREACTIVE
Lyme Disease 39 kD IgG: NONREACTIVE
Lyme Disease 41 kD IgG: REACTIVE — AB
Lyme Disease 66 kD IgG: NONREACTIVE
Lyme Disease 93 kD IgG: NONREACTIVE

## 2015-07-10 MED ORDER — LISDEXAMFETAMINE DIMESYLATE 50 MG PO CAPS: 50.0000 mg | ORAL_CAPSULE | Freq: Every day | ORAL | Status: DC

## 2015-07-11 NOTE — Telephone Encounter (Signed)
Patient informed to pickup hardcopy at the front desk. 

## 2015-07-16 NOTE — Progress Notes (Signed)
Quick Note:  Pt has seen results on MyChart and message also sent for patient to call back if any questions. ______ 

## 2015-08-03 ENCOUNTER — Other Ambulatory Visit: Payer: Self-pay | Admitting: Family Medicine

## 2015-08-12 ENCOUNTER — Telehealth: Payer: Self-pay

## 2015-08-12 NOTE — Telephone Encounter (Signed)
Pt aware.

## 2015-08-12 NOTE — Telephone Encounter (Signed)
-----   Message from Evalee JeffersonElizabeth A McKew, LPN sent at 1/61/[id number]/23/2017  1:21 PM EDT ----- Due LFT, IBC and ferritin

## 2015-08-30 ENCOUNTER — Other Ambulatory Visit: Payer: Self-pay | Admitting: Family Medicine

## 2015-09-09 ENCOUNTER — Other Ambulatory Visit: Payer: Self-pay | Admitting: Family Medicine

## 2015-09-09 MED ORDER — LISDEXAMFETAMINE DIMESYLATE 50 MG PO CAPS: 50.0000 mg | ORAL_CAPSULE | Freq: Every day | ORAL | 0 refills | Status: DC

## 2015-09-09 NOTE — Telephone Encounter (Signed)
Last refill March 2017 and last OV 06/06/2015

## 2015-10-08 ENCOUNTER — Ambulatory Visit: Payer: Self-pay | Admitting: Physician Assistant

## 2015-10-08 ENCOUNTER — Telehealth: Payer: Self-pay | Admitting: Family Medicine

## 2015-10-08 ENCOUNTER — Other Ambulatory Visit: Payer: Self-pay | Admitting: Family Medicine

## 2015-10-08 DIAGNOSIS — Z0289 Encounter for other administrative examinations: Secondary | ICD-10-CM

## 2015-10-08 NOTE — Telephone Encounter (Signed)
Called patient to inform him that he had scheduled appointment on My Chart with the wrong provider and that he needed to schedule with Dr. Abner GreenspanBlyth. (per [place]). Patient wanted to have the same appointment time on Friday with Dr, Abner GreenspanBlyth and when informed that she did not have that time available, patient hung up. Appointment for Friday was cancelled because it was for a medication (controlled substance) follow up and needed to be with Dr Abner GreenspanBlyth.

## 2015-10-08 NOTE — Telephone Encounter (Signed)
He will also need to be scheduled with his PCP for his Vyvanse refills.

## 2015-10-08 NOTE — Telephone Encounter (Signed)
Patient mother LVM stating patient would not be able to make 1:15pm appointment for today [facility] Charge or no charge

## 2015-10-08 NOTE — Telephone Encounter (Signed)
charge 

## 2015-10-09 NOTE — Telephone Encounter (Signed)
I am willing to see him early at 7:30 if he would like

## 2015-10-10 ENCOUNTER — Encounter: Payer: Self-pay | Admitting: Family Medicine

## 2015-10-10 NOTE — Telephone Encounter (Signed)
Patient scheduled with PCP 10/11/15

## 2015-10-11 ENCOUNTER — Encounter: Payer: Self-pay | Admitting: Family Medicine

## 2015-10-11 ENCOUNTER — Ambulatory Visit (INDEPENDENT_AMBULATORY_CARE_PROVIDER_SITE_OTHER): Payer: 59 | Admitting: Family Medicine

## 2015-10-11 ENCOUNTER — Ambulatory Visit: Payer: Self-pay | Admitting: Physician Assistant

## 2015-10-11 VITALS — BP 142/70 | HR 78 | Temp 98.0°F | Ht 75.0 in | Wt 325.2 lb

## 2015-10-11 DIAGNOSIS — I10 Essential (primary) hypertension: Secondary | ICD-10-CM

## 2015-10-11 DIAGNOSIS — R739 Hyperglycemia, unspecified: Secondary | ICD-10-CM | POA: Diagnosis not present

## 2015-10-11 DIAGNOSIS — K219 Gastro-esophageal reflux disease without esophagitis: Secondary | ICD-10-CM | POA: Diagnosis not present

## 2015-10-11 DIAGNOSIS — E782 Mixed hyperlipidemia: Secondary | ICD-10-CM | POA: Insufficient documentation

## 2015-10-11 DIAGNOSIS — F909 Attention-deficit hyperactivity disorder, unspecified type: Secondary | ICD-10-CM

## 2015-10-11 DIAGNOSIS — E663 Overweight: Secondary | ICD-10-CM

## 2015-10-11 DIAGNOSIS — Z0000 Encounter for general adult medical examination without abnormal findings: Secondary | ICD-10-CM

## 2015-10-11 HISTORY — DX: Hyperglycemia, unspecified: R73.9

## 2015-10-11 HISTORY — DX: Mixed hyperlipidemia: E78.2

## 2015-10-11 LAB — LIPID PANEL
CHOL/HDL RATIO: 4
Cholesterol: 173 mg/dL (ref 0–200)
HDL: 39.7 mg/dL (ref >39.00)
LDL Cholesterol: 94 mg/dL (ref 0–99)
NONHDL: 133.71
TRIGLYCERIDES: 197 mg/dL — AB (ref 0.0–149.0)
VLDL: 39.4 mg/dL (ref 0.0–40.0)

## 2015-10-11 LAB — CBC
HEMATOCRIT: 47.1 % (ref 39.0–52.0)
HEMOGLOBIN: 16.3 g/dL (ref 13.0–17.0)
MCHC: 34.5 g/dL (ref 30.0–36.0)
MCV: 86.1 fl (ref 78.0–100.0)
Platelets: 279 10*3/uL (ref 150.0–400.0)
RBC: 5.47 Mil/uL (ref 4.22–5.81)
RDW: 13.2 % (ref 11.5–15.5)
WBC: 8.1 10*3/uL (ref 4.0–10.5)

## 2015-10-11 LAB — COMPREHENSIVE METABOLIC PANEL
ALT: 204 U/L — AB (ref 0–53)
AST: 127 U/L — ABNORMAL HIGH (ref 0–37)
Albumin: 4.2 g/dL (ref 3.5–5.2)
Alkaline Phosphatase: 86 U/L (ref 39–117)
BUN: 14 mg/dL (ref 6–23)
CHLORIDE: 106 meq/L (ref 96–112)
CO2: 24 meq/L (ref 19–32)
Calcium: 9.1 mg/dL (ref 8.4–10.5)
Creatinine, Ser: 0.97 mg/dL (ref 0.40–1.50)
GFR: 101.89 mL/min (ref >60.00)
GLUCOSE: 90 mg/dL (ref 70–99)
POTASSIUM: 4 meq/L (ref 3.5–5.1)
Sodium: 138 mEq/L (ref 135–145)
Total Bilirubin: 0.5 mg/dL (ref 0.2–1.2)
Total Protein: 7.2 g/dL (ref 6.0–8.3)

## 2015-10-11 LAB — TSH: TSH: 1.67 u[IU]/mL (ref 0.35–4.50)

## 2015-10-11 LAB — HEMOGLOBIN A1C: Hgb A1c MFr Bld: 5.2 % (ref 4.6–6.5)

## 2015-10-11 MED ORDER — LISDEXAMFETAMINE DIMESYLATE 50 MG PO CAPS: 50.0000 mg | ORAL_CAPSULE | Freq: Every day | ORAL | 0 refills | Status: DC

## 2015-10-11 MED ORDER — CLOTRIMAZOLE-BETAMETHASONE 1-0.05 % EX CREA: 1.0000 "application " | TOPICAL_CREAM | Freq: Two times a day (BID) | CUTANEOUS | 1 refills | Status: DC

## 2015-10-11 NOTE — Progress Notes (Signed)
Pre visit review using our clinic review tool, if applicable. No additional management support is needed unless otherwise documented below in the visit note. 

## 2015-10-11 NOTE — Patient Instructions (Signed)
DASH Eating Plan  DASH stands for "Dietary Approaches to Stop Hypertension." The DASH eating plan is a healthy eating plan that has been shown to reduce high blood pressure (hypertension). Additional health benefits may include reducing the risk of type 2 diabetes mellitus, heart disease, and stroke. The DASH eating plan may also help with weight loss.  WHAT DO I NEED TO KNOW ABOUT THE DASH EATING PLAN?  For the DASH eating plan, you will follow these general guidelines:  · Choose foods with a percent daily value for sodium of less than 5% (as listed on the food label).  · Use salt-free seasonings or herbs instead of table salt or sea salt.  · Check with your health care provider or pharmacist before using salt substitutes.  · Eat lower-sodium products, often labeled as "lower sodium" or "no salt added."  · Eat fresh foods.  · Eat more vegetables, fruits, and low-fat dairy products.  · Choose whole grains. Look for the word "whole" as the first word in the ingredient list.  · Choose fish and skinless chicken or turkey more often than red meat. Limit fish, poultry, and meat to 6 oz (170 g) each day.  · Limit sweets, desserts, sugars, and sugary drinks.  · Choose heart-healthy fats.  · Limit cheese to 1 oz (28 g) per day.  · Eat more home-cooked food and less restaurant, buffet, and fast food.  · Limit fried foods.  · Cook foods using methods other than frying.  · Limit canned vegetables. If you do use them, rinse them well to decrease the sodium.  · When eating at a restaurant, ask that your food be prepared with less salt, or no salt if possible.  WHAT FOODS CAN I EAT?  Seek help from a dietitian for individual calorie needs.  Grains  Whole grain or whole wheat bread. Brown rice. Whole grain or whole wheat pasta. Quinoa, bulgur, and whole grain cereals. Low-sodium cereals. Corn or whole wheat flour tortillas. Whole grain cornbread. Whole grain crackers. Low-sodium crackers.  Vegetables  Fresh or frozen vegetables  (raw, steamed, roasted, or grilled). Low-sodium or reduced-sodium tomato and vegetable juices. Low-sodium or reduced-sodium tomato sauce and paste. Low-sodium or reduced-sodium canned vegetables.   Fruits  All fresh, canned (in natural juice), or frozen fruits.  Meat and Other Protein Products  Ground beef (85% or leaner), grass-fed beef, or beef trimmed of fat. Skinless chicken or turkey. Ground chicken or turkey. Pork trimmed of fat. All fish and seafood. Eggs. Dried beans, peas, or lentils. Unsalted nuts and seeds. Unsalted canned beans.  Dairy  Low-fat dairy products, such as skim or 1% milk, 2% or reduced-fat cheeses, low-fat ricotta or cottage cheese, or plain low-fat yogurt. Low-sodium or reduced-sodium cheeses.  Fats and Oils  Tub margarines without trans fats. Light or reduced-fat mayonnaise and salad dressings (reduced sodium). Avocado. Safflower, olive, or canola oils. Natural peanut or almond butter.  Other  Unsalted popcorn and pretzels.  The items listed above may not be a complete list of recommended foods or beverages. Contact your dietitian for more options.  WHAT FOODS ARE NOT RECOMMENDED?  Grains  White bread. White pasta. White rice. Refined cornbread. Bagels and croissants. Crackers that contain trans fat.  Vegetables  Creamed or fried vegetables. Vegetables in a cheese sauce. Regular canned vegetables. Regular canned tomato sauce and paste. Regular tomato and vegetable juices.  Fruits  Dried fruits. Canned fruit in light or heavy syrup. Fruit juice.  Meat and Other Protein   Products  Fatty cuts of meat. Ribs, chicken wings, bacon, sausage, bologna, salami, chitterlings, fatback, hot dogs, bratwurst, and packaged luncheon meats. Salted nuts and seeds. Canned beans with salt.  Dairy  Whole or 2% milk, cream, half-and-half, and cream cheese. Whole-fat or sweetened yogurt. Full-fat cheeses or blue cheese. Nondairy creamers and whipped toppings. Processed cheese, cheese spreads, or cheese  curds.  Condiments  Onion and garlic salt, seasoned salt, table salt, and sea salt. Canned and packaged gravies. Worcestershire sauce. Tartar sauce. Barbecue sauce. Teriyaki sauce. Soy sauce, including reduced sodium. Steak sauce. Fish sauce. Oyster sauce. Cocktail sauce. Horseradish. Ketchup and mustard. Meat flavorings and tenderizers. Bouillon cubes. Hot sauce. Tabasco sauce. Marinades. Taco seasonings. Relishes.  Fats and Oils  Butter, stick margarine, lard, shortening, ghee, and bacon fat. Coconut, palm kernel, or palm oils. Regular salad dressings.  Other  Pickles and olives. Salted popcorn and pretzels.  The items listed above may not be a complete list of foods and beverages to avoid. Contact your dietitian for more information.  WHERE CAN I FIND MORE INFORMATION?  [employer], [employer]: www.nhlbi.nih.gov/health/health-topics/topics/dash/     This information is not intended to replace advice given to you by your health care provider. Make sure you discuss any questions you have with your health care provider.     Document Released: 01/22/2011 Document Revised: 02/23/2014 Document Reviewed: 12/07/2012  Elsevier Interactive Patient Education ©2016 [employer]

## 2015-10-12 LAB — HIV ANTIBODY (ROUTINE TESTING W REFLEX): HIV: NONREACTIVE

## 2015-10-27 NOTE — Assessment & Plan Note (Addendum)
Encouraged DASH diet, decrease po intake and increase exercise as tolerated. Needs 7-8 hours of sleep nightly. Avoid trans fats, eat small, frequent meals every 4-5 hours with lean proteins, complex carbs and healthy fats. Minimize simple carbs 

## 2015-10-27 NOTE — Progress Notes (Signed)
Patient ID: Scott Pope, male   DOB: 1990-07-19, 25 y.o.   MRN: [id number]   Subjective:    Patient ID: Scott Pope, male    DOB: [birth year], 25 y.o.   MRN: [id number]  Chief Complaint  Patient presents with  . Follow-up    Vyvanse    HPI Patient is in today for follow up. He feels well today. No recent illness or acute concerns. Is hoping for refills on medications. Denies CP/palp/SOB/HA/congestion/fevers/GI or GU c/o. Taking meds as prescribed  Past Medical History:  Diagnosis Date  . Abdominal pain, epigastric 04/04/2015  . Acute low back pain 07/07/2010  . ADHD (attention deficit hyperactivity disorder) 07/07/2010  . Allergic state 09/04/2010  . Benign essential HTN 01/14/2009   Qualifier: Diagnosis of  By: Caryl Never MD, Bruce    . 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 Cough 04/06/2011  . Depression   . ELEVATED BLOOD PRESSURE 01/14/2009  . GERD 01/14/2009  . Hyperglycemia 10/11/2015  . Hyperlipidemia, mixed 10/11/2015  . IBS (irritable bowel syndrome) 08/25/2013  . Insomnia 08/04/2010  . Knee pain, left 09/24/2011  . Otitis media 11/24/2011  . Otitis media of left ear 12/09/2010  . Overweight(278.02) 07/07/2010  . Preventative health care 11/22/2012  . RUQ pain 04/04/2015  . Skin lesion of left leg 04/01/2011  . SOB (shortness of breath) on exertion 03/06/2011  . 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 Tick bite 10/17/2012  . Tinea corporis 11/22/2012  . Viral gastroenteritis 10/17/2010    Past Surgical History:  Procedure Laterality Date  . CIRCUMCISION  AT 25YRS OLD   HAD WILD REACTION TO SEDATION MED GIVEN  . THYROGLOSSAL DUCT CYST     excised at 25 years of age  . TONSILLECTOMY AND ADENOIDECTOMY    . TYMPANOSTOMY TUBE PLACEMENT     multiple     Family History  Problem Relation Age of Onset  . Obesity Mother   . Depression Mother   . Fibromyalgia Mother   . Hypertension Mother   . Chronic fatigue Mother   . Hypertension Father   . Fibromyalgia Maternal Grandmother     . Other Maternal Grandmother     reflux  . Other Maternal Grandfather     Lems disease  . Hypertension Paternal Grandfather   . Arthritis Paternal Grandfather   . Colon cancer Neg Hx   . Esophageal cancer Neg Hx   . Stomach cancer Neg Hx   . Pancreatic cancer Neg Hx   . Gallbladder disease      unknown  . Colon polyps Neg Hx     Social History   Social History  . Marital status: Single    Spouse name: N/A  . Number of children: N/A  . Years of education: N/A   Occupational History  . Not on file.   Social History Main Topics  . Smoking status: Never Smoker  . Smokeless tobacco: Never Used  . Alcohol use No  . Drug use: No  . Sexual activity: No   Other Topics Concern  . Not on file   Social History Narrative  . No narrative on file    Outpatient Medications Prior to Visit  Medication Sig Dispense Refill  . metoprolol succinate (TOPROL-XL) 50 MG 24 hr tablet Take 1 tablet (50 mg total) by mouth daily. Take with or immediately following a meal. 30 tablet 3  . pantoprazole (PROTONIX) 40 MG tablet TAKE 1 TABLET (40 MG TOTAL) BY MOUTH DAILY. 30 tablet 2  .  lisdexamfetamine (VYVANSE) 50 MG capsule Take 1 capsule (50 mg total) by mouth daily. March. 2017 30 capsule 0  . dicyclomine (BENTYL) 10 MG capsule Take 1 tab 30 min before meals. (Patient not taking: Reported on 10/11/2015) 90 capsule 1  . escitalopram (LEXAPRO) 10 MG tablet TAKE 1 TABLET (10 MG TOTAL) BY MOUTH DAILY. (Patient not taking: Reported on 10/11/2015) 30 tablet 2  . promethazine (PHENERGAN) 25 MG tablet Take 1 tablet (25 mg total) by mouth every 8 (eight) hours as needed for nausea or vomiting. 30 tablet 1  . ALPRAZolam (XANAX) 0.25 MG tablet Take 1 tablet (0.25 mg total) by mouth 2 (two) times daily as needed for anxiety. 10 tablet 0  . doxycycline (VIBRA-TABS) 100 MG tablet Take 1 tablet (100 mg total) by mouth 2 (two) times daily. 20 tablet 0   No facility-administered medications prior to visit.      Allergies  Allergen Reactions  . Strattera [Atomoxetine Hcl]     Suicidal thoughts, depression    Review of Systems  Constitutional: Negative for fever and malaise/fatigue.  HENT: Negative for congestion.   Eyes: Negative for blurred vision.  Respiratory: Negative for shortness of breath.   Cardiovascular: Negative for chest pain, palpitations and leg swelling.  Gastrointestinal: Negative for abdominal pain, blood in stool and nausea.  Genitourinary: Negative for dysuria and frequency.  Musculoskeletal: Negative for falls.  Skin: Negative for rash.  Neurological: Negative for dizziness, loss of consciousness and headaches.  Endo/Heme/Allergies: Negative for environmental allergies.  Psychiatric/Behavioral: Negative for depression. The patient is not nervous/anxious.        Objective:    Physical Exam  Constitutional: He is oriented to person, place, and time. He appears well-developed and well-nourished. No distress.  HENT:  Head: Normocephalic and atraumatic.  Nose: Nose normal.  Eyes: Right eye exhibits no discharge. Left eye exhibits no discharge.  Neck: Normal range of motion. Neck supple.  Cardiovascular: Normal rate and regular rhythm.   No murmur heard. Pulmonary/Chest: Effort normal and breath sounds normal.  Abdominal: Soft. Bowel sounds are normal. There is no tenderness.  Musculoskeletal: He exhibits no edema.  Neurological: He is alert and oriented to person, place, and time.  Skin: Skin is warm and dry.  Psychiatric: He has a normal mood and affect.  Nursing note and vitals reviewed.   BP (!) 142/70 (BP Location: Right Arm, Patient Position: Sitting, Cuff Size: Large)   Pulse 78   Temp 98 F (36.7 C) (Oral)   Ht 6\' 3"  (1.905 m)   Wt (!) 325 lb 4 oz (147.5 kg)   SpO2 96%   BMI 40.65 kg/m  Wt Readings from Last 3 Encounters:  10/11/15 (!) 325 lb 4 oz (147.5 kg)  07/08/15 (!) 311 lb (141.1 kg)  06/06/15 (!) 313 lb 8 oz (142.2 kg)     Lab  Results  Component Value Date   WBC 8.1 10/11/2015   HGB 16.3 10/11/2015   HCT 47.1 10/11/2015   PLT 279.0 10/11/2015   GLUCOSE 90 10/11/2015   CHOL 173 10/11/2015   TRIG 197.0 (H) 10/11/2015   HDL 39.70 10/11/2015   LDLCALC 94 10/11/2015   ALT 204 (H) 10/11/2015   AST 127 (H) 10/11/2015   NA 138 10/11/2015   K 4.0 10/11/2015   CL 106 10/11/2015   CREATININE 0.97 10/11/2015   BUN 14 10/11/2015   CO2 24 10/11/2015   TSH 1.67 10/11/2015   HGBA1C 5.2 10/11/2015    Lab Results  Component Value Date   TSH 1.67 10/11/2015   Lab Results  Component Value Date   WBC 8.1 10/11/2015   HGB 16.3 10/11/2015   HCT 47.1 10/11/2015   MCV 86.1 10/11/2015   PLT 279.0 10/11/2015   Lab Results  Component Value Date   NA 138 10/11/2015   K 4.0 10/11/2015   CO2 24 10/11/2015   GLUCOSE 90 10/11/2015   BUN 14 10/11/2015   CREATININE 0.97 10/11/2015   BILITOT 0.5 10/11/2015   ALKPHOS 86 10/11/2015   AST 127 (H) 10/11/2015   ALT 204 (H) 10/11/2015   PROT 7.2 10/11/2015   ALBUMIN 4.2 10/11/2015   CALCIUM 9.1 10/11/2015   GFR 101.89 10/11/2015   Lab Results  Component Value Date   CHOL 173 10/11/2015   Lab Results  Component Value Date   HDL 39.70 10/11/2015   Lab Results  Component Value Date   LDLCALC 94 10/11/2015   Lab Results  Component Value Date   TRIG 197.0 (H) 10/11/2015   Lab Results  Component Value Date   CHOLHDL 4 10/11/2015   Lab Results  Component Value Date   HGBA1C 5.2 10/11/2015       Assessment & Plan:   Problem List Items Addressed This Visit    GERD - Primary    Avoid offending foods, yskr probiotics. Do not eat large meals in late evening and consider raising head of bed.       Benign essential HTN    Adequately controlled, no changes to meds. Encouraged heart healthy diet such as the DASH diet and exercise as tolerated.       Relevant Orders   CBC (Completed)   Comprehensive metabolic panel (Completed)   TSH (Completed)    Overweight    Encouraged DASH diet, decrease po intake and increase exercise as tolerated. Needs 7-8 hours of sleep nightly. Avoid trans fats, eat small, frequent meals every 4-5 hours with lean proteins, complex carbs and healthy fats. Minimize simple carbs      ADHD (attention deficit hyperactivity disorder)    Allowed refill on Vyvanse      Preventative health care   Relevant Orders   HIV antibody (with reflex) (Completed)   Hyperlipidemia, mixed    Encouraged heart healthy diet, increase exercise, avoid trans fats, consider a krill oil cap daily      Relevant Orders   Lipid panel (Completed)   Hyperglycemia    minimize simple carbs. Increase exercise as tolerated.       Relevant Orders   Hemoglobin A1c (Completed)    Other Visit Diagnoses   None.     I have discontinued Mr. Burgo's ALPRAZolam and doxycycline. I have also changed his lisdexamfetamine, lisdexamfetamine, and lisdexamfetamine. Additionally, I am having him start on clotrimazole-betamethasone. Lastly, I am having him maintain his metoprolol succinate, dicyclomine, promethazine, pantoprazole, and escitalopram.  Meds ordered this encounter  Medications  . DISCONTD: lisdexamfetamine (VYVANSE) 50 MG capsule    Sig: Take 50 mg by mouth daily.  Marland Kitchen. DISCONTD: lisdexamfetamine (VYVANSE) 50 MG capsule    Sig: Take 50 mg by mouth daily.  Marland Kitchen. lisdexamfetamine (VYVANSE) 50 MG capsule    Sig: Take 1 capsule (50 mg total) by mouth daily. August . 2017    Dispense:  30 capsule    Refill:  0  . lisdexamfetamine (VYVANSE) 50 MG capsule    Sig: Take 1 capsule (50 mg total) by mouth daily. September 2017    Dispense:  30 capsule  Refill:  0  . lisdexamfetamine (VYVANSE) 50 MG capsule    Sig: Take 1 capsule (50 mg total) by mouth daily. October 2017    Dispense:  30 capsule    Refill:  0  . clotrimazole-betamethasone (LOTRISONE) cream    Sig: Apply 1 application topically 2 (two) times daily.    Dispense:  45 g    Refill:   1     Danise Edge, MD

## 2015-10-27 NOTE — Assessment & Plan Note (Signed)
Encouraged heart healthy diet, increase exercise, avoid trans fats, consider a krill oil cap daily 

## 2015-10-27 NOTE — Assessment & Plan Note (Signed)
minimize simple carbs. Increase exercise as tolerated.  

## 2015-10-27 NOTE — Assessment & Plan Note (Signed)
Allowed refill on Vyvanse 

## 2015-10-27 NOTE — Assessment & Plan Note (Signed)
Adequately controlled, no changes to meds. Encouraged heart healthy diet such as the DASH diet and exercise as tolerated.  

## 2015-10-27 NOTE — Assessment & Plan Note (Signed)
Avoid offending foods, yskr probiotics. Do not eat large meals in late evening and consider raising head of bed.

## 2015-11-03 ENCOUNTER — Other Ambulatory Visit: Payer: Self-pay | Admitting: Family Medicine

## 2015-11-19 ENCOUNTER — Encounter: Payer: Self-pay | Admitting: Family Medicine

## 2015-11-19 ENCOUNTER — Ambulatory Visit (INDEPENDENT_AMBULATORY_CARE_PROVIDER_SITE_OTHER): Payer: 59 | Admitting: Family Medicine

## 2015-11-19 VITALS — BP 120/82 | HR 82 | Temp 97.7°F | Wt 325.8 lb

## 2015-11-19 DIAGNOSIS — H918X1 Other specified hearing loss, right ear: Secondary | ICD-10-CM | POA: Diagnosis not present

## 2015-11-19 DIAGNOSIS — I10 Essential (primary) hypertension: Secondary | ICD-10-CM

## 2015-11-19 DIAGNOSIS — H9201 Otalgia, right ear: Secondary | ICD-10-CM | POA: Diagnosis not present

## 2015-11-19 DIAGNOSIS — S09301S Unspecified injury of right middle and inner ear, sequela: Secondary | ICD-10-CM | POA: Diagnosis not present

## 2015-11-19 NOTE — Progress Notes (Signed)
Pre visit review using our clinic review tool, if applicable. No additional management support is needed unless otherwise documented below in the visit note. 

## 2015-11-19 NOTE — Patient Instructions (Signed)
Tympanic Membrane Perforation The eardrum (tympanic membrane) protects the inner ear from the outside environment. In addition to protection, the eardrum allows you to hear by transmitting sound waves to the bones in your ear and then to the nervous system. The tympanic membrane is easily perforated, which may result in damage to the inner ear. SYMPTOMS   Sometimes there are no symptoms.  Decreased hearing.  Fluid drainage from ear.  Ear pain. CAUSES   Most commonly, a middle ear infection from built-up pressure.  Injury from a cotton swab.  Traumatic injury to the side of the head. RISK INCREASES WITH:  Frequent middle ear infections.  Use of cotton swabs. PREVENTION   Do not use cotton swabs to clean the ear canal.  If you have ear pain or pressure, see your caregiver to rule out an ear infection that needs treatment. TREATMENT  Protecting the inner ear and allowing the membrane to heal on its own is how tympanic membrane rupture is usually treated. Healing may take several weeks. In order to protect the inner ear, do not allow any fluid to enter the ear canal. Avoid being submerged in water. The use of ear drops may prevent an ear infection from developing, but they should be used with caution, as ear drops can also cause damage to the inner ear. It is important to follow up with your caregiver to confirm healing of the tympanic membrane. If the membrane does not heal, permanent hearing loss may occur. To avoid serious complications, tympanic membranes that do not heal on their own are repaired with surgery.   This information is not intended to replace advice given to you by your health care provider. Make sure you discuss any questions you have with your health care provider.   Document Released: 02/02/2005 Document Revised: 04/27/2011 Document Reviewed: 08/15/2014 Elsevier Interactive Patient Education 2016 [employer]  

## 2015-11-20 DIAGNOSIS — S09301A Unspecified injury of right middle and inner ear, initial encounter: Secondary | ICD-10-CM | POA: Insufficient documentation

## 2015-11-20 NOTE — Progress Notes (Signed)
Patient ID: Scott Pope, male   DOB: 01/25/1991, 25 y.o.   MRN: [id number]   Subjective:    Patient ID: Scott Pope, male    DOB: [birth year], 25 y.o.   MRN: [id number]  Chief Complaint  Patient presents with  . Hearing Loss    HPI Patient is in today for evaluation of a sense of decreased hearing. He and his family are noting that he is having trouble with his hearing. He suffered a right sided ruptured ear drum and he has continued to have intermittent ear pain. More recently he is having more trouble hearing things in a crowded place or if he is turned away. Denies CP/palp/SOB/HA/congestion/fevers/GI or GU c/o. Taking meds as prescribed  Past Medical History:  Diagnosis Date  . Abdominal pain, epigastric 04/04/2015  . Acute low back pain 07/07/2010  . ADHD (attention deficit hyperactivity disorder) 07/07/2010  . Allergic state 09/04/2010  . Benign essential HTN 01/14/2009   Qualifier: Diagnosis of  By: Caryl Never MD, Bruce    . 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 Cough 04/06/2011  . Depression   . ELEVATED BLOOD PRESSURE 01/14/2009  . GERD 01/14/2009  . Hyperglycemia 10/11/2015  . Hyperlipidemia, mixed 10/11/2015  . IBS (irritable bowel syndrome) 08/25/2013  . Insomnia 08/04/2010  . Knee pain, left 09/24/2011  . Otitis media 11/24/2011  . Otitis media of left ear 12/09/2010  . Overweight(278.02) 07/07/2010  . Preventative health care 11/22/2012  . RUQ pain 04/04/2015  . Skin lesion of left leg 04/01/2011  . SOB (shortness of breath) on exertion 03/06/2011  . 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 Tick bite 10/17/2012  . Tinea corporis 11/22/2012  . Viral gastroenteritis 10/17/2010    Past Surgical History:  Procedure Laterality Date  . CIRCUMCISION  AT 25YRS OLD   HAD WILD REACTION TO SEDATION MED GIVEN  . THYROGLOSSAL DUCT CYST     excised at 25 years of age  . TONSILLECTOMY AND ADENOIDECTOMY    . TYMPANOSTOMY TUBE PLACEMENT     multiple     Family History  Problem Relation  Age of Onset  . Obesity Mother   . Depression Mother   . Fibromyalgia Mother   . Hypertension Mother   . Chronic fatigue Mother   . Hypertension Father   . Fibromyalgia Maternal Grandmother   . Other Maternal Grandmother     reflux  . Other Maternal Grandfather     Lems disease  . Hypertension Paternal Grandfather   . Arthritis Paternal Grandfather   . Colon cancer Neg Hx   . Esophageal cancer Neg Hx   . Stomach cancer Neg Hx   . Pancreatic cancer Neg Hx   . Gallbladder disease      unknown  . Colon polyps Neg Hx     Social History   Social History  . Marital status: Single    Spouse name: N/A  . Number of children: N/A  . Years of education: N/A   Occupational History  . Not on file.   Social History Main Topics  . Smoking status: Never Smoker  . Smokeless tobacco: Never Used  . Alcohol use No  . Drug use: No  . Sexual activity: No   Other Topics Concern  . Not on file   Social History Narrative  . No narrative on file    Outpatient Medications Prior to Visit  Medication Sig Dispense Refill  . lisdexamfetamine (VYVANSE) 50 MG capsule Take 1 capsule (50 mg total) by  mouth daily. August . 2017 30 capsule 0  . lisdexamfetamine (VYVANSE) 50 MG capsule Take 1 capsule (50 mg total) by mouth daily. September 2017 30 capsule 0  . lisdexamfetamine (VYVANSE) 50 MG capsule Take 1 capsule (50 mg total) by mouth daily. October 2017 30 capsule 0  . metoprolol succinate (TOPROL-XL) 50 MG 24 hr tablet TAKE 1 TABLET (50 MG TOTAL) BY MOUTH DAILY. TAKE WITH OR IMMEDIATELY FOLLOWING A MEAL. 30 tablet 3  . pantoprazole (PROTONIX) 40 MG tablet TAKE 1 TABLET (40 MG TOTAL) BY MOUTH DAILY. 30 tablet 2  . clotrimazole-betamethasone (LOTRISONE) cream Apply 1 application topically 2 (two) times daily. 45 g 1  . dicyclomine (BENTYL) 10 MG capsule Take 1 tab 30 min before meals. (Patient not taking: Reported on 10/11/2015) 90 capsule 1  . escitalopram (LEXAPRO) 10 MG tablet TAKE 1 TABLET  (10 MG TOTAL) BY MOUTH DAILY. (Patient not taking: Reported on 10/11/2015) 30 tablet 2  . promethazine (PHENERGAN) 25 MG tablet Take 1 tablet (25 mg total) by mouth every 8 (eight) hours as needed for nausea or vomiting. 30 tablet 1   No facility-administered medications prior to visit.     Allergies  Allergen Reactions  . Strattera [Atomoxetine Hcl]     Suicidal thoughts, depression    Review of Systems  Constitutional: Negative for fever and malaise/fatigue.  HENT: Positive for ear pain and hearing loss. Negative for congestion, ear discharge and tinnitus.   Eyes: Negative for blurred vision.  Respiratory: Negative for shortness of breath.   Cardiovascular: Negative for chest pain, palpitations and leg swelling.  Gastrointestinal: Negative for abdominal pain, blood in stool and nausea.  Genitourinary: Negative for dysuria and frequency.  Musculoskeletal: Negative for back pain.  Skin: Negative for rash.  Neurological: Negative for dizziness, loss of consciousness and headaches.  Endo/Heme/Allergies: Negative for environmental allergies.       Objective:    Physical Exam  Constitutional: He is oriented to person, place, and time. He appears well-developed and well-nourished. No distress.  HENT:  Head: Normocephalic and atraumatic.  Nose: Nose normal.  Right TM scarred and with an opening.   Eyes: Right eye exhibits no discharge. Left eye exhibits no discharge.  Neck: Normal range of motion. Neck supple.  Cardiovascular: Normal rate and regular rhythm.   No murmur heard. Pulmonary/Chest: Effort normal and breath sounds normal.  Abdominal: Soft. Bowel sounds are normal. There is no tenderness.  Musculoskeletal: He exhibits no edema.  Neurological: He is alert and oriented to person, place, and time.  Skin: Skin is warm and dry.  Psychiatric: He has a normal mood and affect.  Nursing note and vitals reviewed.   BP 120/82 (BP Location: Left Arm, Patient Position: Sitting,  Cuff Size: Normal)   Pulse 82   Temp 97.7 F (36.5 C) (Oral)   Wt (!) 325 lb 12.8 oz (147.8 kg)   SpO2 97%   BMI 40.72 kg/m  Wt Readings from Last 3 Encounters:  11/19/15 (!) 325 lb 12.8 oz (147.8 kg)  10/11/15 (!) 325 lb 4 oz (147.5 kg)  07/08/15 (!) 311 lb (141.1 kg)     Lab Results  Component Value Date   WBC 8.1 10/11/2015   HGB 16.3 10/11/2015   HCT 47.1 10/11/2015   PLT 279.0 10/11/2015   GLUCOSE 90 10/11/2015   CHOL 173 10/11/2015   TRIG 197.0 (H) 10/11/2015   HDL 39.70 10/11/2015   LDLCALC 94 10/11/2015   ALT 204 (H) 10/11/2015   AST  127 (H) 10/11/2015   NA 138 10/11/2015   K 4.0 10/11/2015   CL 106 10/11/2015   CREATININE 0.97 10/11/2015   BUN 14 10/11/2015   CO2 24 10/11/2015   TSH 1.67 10/11/2015   HGBA1C 5.2 10/11/2015    Lab Results  Component Value Date   TSH 1.67 10/11/2015   Lab Results  Component Value Date   WBC 8.1 10/11/2015   HGB 16.3 10/11/2015   HCT 47.1 10/11/2015   MCV 86.1 10/11/2015   PLT 279.0 10/11/2015   Lab Results  Component Value Date   NA 138 10/11/2015   K 4.0 10/11/2015   CO2 24 10/11/2015   GLUCOSE 90 10/11/2015   BUN 14 10/11/2015   CREATININE 0.97 10/11/2015   BILITOT 0.5 10/11/2015   ALKPHOS 86 10/11/2015   AST 127 (H) 10/11/2015   ALT 204 (H) 10/11/2015   PROT 7.2 10/11/2015   ALBUMIN 4.2 10/11/2015   CALCIUM 9.1 10/11/2015   GFR 101.89 10/11/2015   Lab Results  Component Value Date   CHOL 173 10/11/2015   Lab Results  Component Value Date   HDL 39.70 10/11/2015   Lab Results  Component Value Date   LDLCALC 94 10/11/2015   Lab Results  Component Value Date   TRIG 197.0 (H) 10/11/2015   Lab Results  Component Value Date   CHOLHDL 4 10/11/2015   Lab Results  Component Value Date   HGBA1C 5.2 10/11/2015       Assessment & Plan:   Problem List Items Addressed This Visit    Benign essential HTN    Well controlled, no changes to meds. Encouraged heart healthy diet such as the DASH diet  and exercise as tolerated.       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 So will refer back to his ENT for further consideration.       Relevant Orders   Ambulatory referral to ENT    Other Visit Diagnoses    Right ear pain       Relevant Orders   Ambulatory referral to ENT   Other specified hearing loss of right ear, unspecified hearing status on contralateral side       Relevant Orders   Ambulatory referral to ENT      I have discontinued Mr. Kuk's dicyclomine, promethazine, escitalopram, and clotrimazole-betamethasone. I am also having him maintain his pantoprazole, lisdexamfetamine, lisdexamfetamine, lisdexamfetamine, and metoprolol succinate.  No orders of the defined types were placed in this encounter.    Danise EdgeBLYTH, Paighton Godette, MD

## 2015-11-20 NOTE — Assessment & Plan Note (Signed)
Well controlled, no changes to meds. Encouraged heart healthy diet such as the DASH diet and exercise as tolerated.  °

## 2015-11-20 NOTE — Assessment & Plan Note (Signed)
Ruptured his right tympanic ear drum years ago and continues to have trouble with intermittent ear pain and now decreased hearing. So will refer back to his ENT for further consideration.

## 2015-11-26 ENCOUNTER — Other Ambulatory Visit: Payer: Self-pay | Admitting: Family Medicine

## 2015-11-26 MED ORDER — LISDEXAMFETAMINE DIMESYLATE 50 MG PO CAPS: 50.0000 mg | ORAL_CAPSULE | Freq: Every day | ORAL | 0 refills | Status: DC

## 2015-11-26 NOTE — Telephone Encounter (Signed)
Patients mom informed hardcopy ready for pickup/ Also left a detailed message on patients phone

## 2015-12-05 ENCOUNTER — Emergency Department (HOSPITAL_BASED_OUTPATIENT_CLINIC_OR_DEPARTMENT_OTHER): Payer: Worker's Compensation

## 2015-12-05 ENCOUNTER — Emergency Department (HOSPITAL_BASED_OUTPATIENT_CLINIC_OR_DEPARTMENT_OTHER)
Admission: EM | Admit: 2015-12-05 | Discharge: 2015-12-05 | Disposition: A | Discharge status: Home / Self Care | Payer: Worker's Compensation | Attending: Emergency Medicine | Admitting: Emergency Medicine

## 2015-12-05 ENCOUNTER — Encounter (HOSPITAL_BASED_OUTPATIENT_CLINIC_OR_DEPARTMENT_OTHER): Payer: Self-pay

## 2015-12-05 DIAGNOSIS — Z79899 Other long term (current) drug therapy: Secondary | ICD-10-CM | POA: Insufficient documentation

## 2015-12-05 DIAGNOSIS — W11XXXA Fall on and from ladder, initial encounter: Secondary | ICD-10-CM | POA: Insufficient documentation

## 2015-12-05 DIAGNOSIS — Y929 Unspecified place or not applicable: Secondary | ICD-10-CM | POA: Diagnosis not present

## 2015-12-05 DIAGNOSIS — Y9339 Activity, other involving climbing, rappelling and jumping off: Secondary | ICD-10-CM | POA: Diagnosis not present

## 2015-12-05 DIAGNOSIS — F909 Attention-deficit hyperactivity disorder, unspecified type: Secondary | ICD-10-CM | POA: Diagnosis not present

## 2015-12-05 DIAGNOSIS — I10 Essential (primary) hypertension: Secondary | ICD-10-CM | POA: Diagnosis not present

## 2015-12-05 DIAGNOSIS — Y990 Civilian activity done for income or pay: Secondary | ICD-10-CM | POA: Diagnosis not present

## 2015-12-05 DIAGNOSIS — S8992XA Unspecified injury of left lower leg, initial encounter: Secondary | ICD-10-CM | POA: Diagnosis not present

## 2015-12-05 MED ORDER — HYDROCODONE-ACETAMINOPHEN 5-325 MG PO TABS: 1.0000 | ORAL_TABLET | Freq: Four times a day (QID) | ORAL | 0 refills | Status: DC | PRN

## 2015-12-05 MED ORDER — NAPROXEN 250 MG PO TABS
500.0000 mg | ORAL_TABLET | Freq: Once | ORAL | Status: DC
  Filled 2015-12-05: qty 2

## 2015-12-05 NOTE — ED Provider Notes (Signed)
[facility] Provider Note: Scott DellJ. Lane Naria Abbey, MD, FACEP  CSN: [id number] MRN: [id number] ARRIVAL: 12/05/15 at 0247   CHIEF COMPLAINT  Fall   HISTORY OF PRESENT ILLNESS  Scott Pope is a 25 y.o. male who was climbing down a ladder at work yesterday evening about 9 PM. His left foot got caught between the rungs of the ladder and he fell, hyperextending the left knee. There was no immediate pain but he is subsequently had the gradual onset of moderate pain in the posteromedial aspect of the left knee. This pain is sharp and radiates proximally. It is worse with movement or palpation. There is also associated paresthesias of the left first through fourth toes but without complete numbness. There is no functional deficit of the left knee or lower leg. He has a superficial abrasion of the right forearm but he denies other injury.   Past Medical History:  Diagnosis Date  . Abdominal pain, epigastric 04/04/2015  . Acute low back pain 07/07/2010  . ADHD (attention deficit hyperactivity disorder) 07/07/2010  . Allergic state 09/04/2010  . Benign essential HTN 01/14/2009   Qualifier: Diagnosis of  By: Caryl NeverBurchette MD, Bruce    . 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 Cough 04/06/2011  . Depression   . ELEVATED BLOOD PRESSURE 01/14/2009  . GERD 01/14/2009  . Hyperglycemia 10/11/2015  . Hyperlipidemia, mixed 10/11/2015  . IBS (irritable bowel syndrome) 08/25/2013  . Insomnia 08/04/2010  . Knee pain, left 09/24/2011  . Otitis media 11/24/2011  . Otitis media of left ear 12/09/2010  . Overweight(278.02) 07/07/2010  . Preventative health care 11/22/2012  . RUQ pain 04/04/2015  . Skin lesion of left leg 04/01/2011  . SOB (shortness of breath) on exertion 03/06/2011  . 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 Tick bite 10/17/2012  . Tinea corporis 11/22/2012  . Viral gastroenteritis 10/17/2010    Past Surgical History:  Procedure Laterality Date  . CIRCUMCISION  AT 25YRS OLD   HAD WILD REACTION TO  SEDATION MED GIVEN  . THYROGLOSSAL DUCT CYST     excised at 25 years of age  . TONSILLECTOMY AND ADENOIDECTOMY    . TYMPANOSTOMY TUBE PLACEMENT     multiple     Family History  Problem Relation Age of Onset  . Obesity Mother   . Depression Mother   . Fibromyalgia Mother   . Hypertension Mother   . Chronic fatigue Mother   . Hypertension Father   . Fibromyalgia Maternal Grandmother   . Other Maternal Grandmother     reflux  . Other Maternal Grandfather     Lems disease  . Hypertension Paternal Grandfather   . Arthritis Paternal Grandfather   . Gallbladder disease      unknown  . Colon cancer Neg Hx   . Esophageal cancer Neg Hx   . Stomach cancer Neg Hx   . Pancreatic cancer Neg Hx   . Colon polyps Neg Hx     Social History  Substance Use Topics  . Smoking status: Never Smoker  . Smokeless tobacco: Never Used  . Alcohol use No    Prior to Admission medications   Medication Sig Start Date End Date Taking? Authorizing Provider  lisdexamfetamine (VYVANSE) 50 MG capsule Take 1 capsule (50 mg total) by mouth daily. September 2017 10/11/15   Bradd CanaryStacey A Blyth, MD  lisdexamfetamine (VYVANSE) 50 MG capsule Take 1 capsule (50 mg total) by mouth daily. October 2017 10/11/15   Bradd CanaryStacey A Blyth, MD  lisdexamfetamine (  VYVANSE) 50 MG capsule Take 1 capsule (50 mg total) by mouth daily. August . 2017 11/26/15   Bradd Canary, MD  metoprolol succinate (TOPROL-XL) 50 MG 24 hr tablet TAKE 1 TABLET (50 MG TOTAL) BY MOUTH DAILY. TAKE WITH OR IMMEDIATELY FOLLOWING A MEAL. 11/03/15   Bradd Canary, MD  pantoprazole (PROTONIX) 40 MG tablet TAKE 1 TABLET (40 MG TOTAL) BY MOUTH DAILY. 08/04/15   Bradd Canary, MD    Allergies Strattera [atomoxetine hcl]   REVIEW OF SYSTEMS  Negative except as noted here or in the History of Present Illness.   PHYSICAL EXAMINATION  Initial Vital Signs Blood pressure 152/73, pulse 80, temperature 98.9 F (37.2 C), resp. rate 18, height 6\' 3"  (1.905 m), weight  (!) 320 lb (145.2 kg), SpO2 98 %.  Examination General: Well-developed, well-nourished male in no acute distress; appearance consistent with age of record HENT: normocephalic; atraumatic Eyes: pupils equal, round and reactive to light; extraocular muscles intact Neck: supple; No C-spine tenderness Heart: regular rate and rhythm Lungs: clear to auscultation bilaterally Chest: Nontender Abdomen: soft; nondistended; nontender Extremities: No deformity; full range of motion except left knee due to pain; pulses normal; tenderness and palpable knot of the left pes anserinus; no tenderness over the left fibular head Neurologic: Awake, alert and oriented; motor function intact in all extremities and symmetric; no facial droop; altered sensation of the toes of the left foot without complete anesthesia  Skin: Warm and dry; superficial abrasion right forearm Psychiatric: Normal mood and affect   RESULTS  Summary of this visit's results, reviewed by myself:   EKG Interpretation  Date/Time:    Ventricular Rate:    PR Interval:    QRS Duration:   QT Interval:    QTC Calculation:   R Axis:     Text Interpretation:        Laboratory Studies: No results found for this or any previous visit (from the past 24 hour(s)). Imaging Studies: Dg Knee Complete 4 Views Left  Result Date: 12/05/2015 CLINICAL DATA:  25 year old male with fall from ladder and pain over the medial aspect of the left knee. EXAM: LEFT KNEE - COMPLETE 4+ VIEW COMPARISON:  None. FINDINGS: No evidence of fracture, dislocation, or joint effusion. No evidence of arthropathy or other focal bone abnormality. Soft tissues are unremarkable. IMPRESSION: Negative. Electronically Signed   By: Elgie Collard M.D.   On: 12/05/2015 03:56    ED COURSE  Nursing notes and initial vitals signs, including pulse oximetry, reviewed.  Vitals:   12/05/15 0259  BP: 152/73  Pulse: 80  Resp: 18  Temp: 98.9 F (37.2 C)  SpO2: 98%  Weight:  (!) 320 lb (145.2 kg)  Height: 6\' 3"  (1.905 m)    PROCEDURES    ED DIAGNOSES     ICD-9-CM ICD-10-CM   1. Left knee injury, initial encounter 959.7 S89.92XA   2. Fall from ladder, initial encounter E881.0 W11.Neldon Newport, MD 12/05/15 [phone]

## 2015-12-05 NOTE — ED Triage Notes (Signed)
Pt states fell off the latter in a walk in freezer at work, states around [phone], foot hung up in the latter, c/o lt leg pain and lt foot numbness

## 2016-01-21 ENCOUNTER — Other Ambulatory Visit: Payer: Self-pay | Admitting: Family Medicine

## 2016-01-23 ENCOUNTER — Other Ambulatory Visit: Payer: Self-pay | Admitting: Family Medicine

## 2016-01-23 MED ORDER — LISDEXAMFETAMINE DIMESYLATE 50 MG PO CAPS: 50.0000 mg | ORAL_CAPSULE | Freq: Every day | ORAL | 0 refills | Status: DC

## 2016-01-29 ENCOUNTER — Telehealth: Payer: Self-pay | Admitting: Family Medicine

## 2016-01-29 ENCOUNTER — Emergency Department (HOSPITAL_BASED_OUTPATIENT_CLINIC_OR_DEPARTMENT_OTHER)
Admission: EM | Admit: 2016-01-29 | Discharge: 2016-01-29 | Disposition: A | Discharge status: Home / Self Care | Payer: 59 | Attending: Emergency Medicine | Admitting: Emergency Medicine

## 2016-01-29 ENCOUNTER — Encounter (HOSPITAL_BASED_OUTPATIENT_CLINIC_OR_DEPARTMENT_OTHER): Payer: Self-pay | Admitting: Respiratory Therapy

## 2016-01-29 DIAGNOSIS — R0789 Other chest pain: Secondary | ICD-10-CM

## 2016-01-29 DIAGNOSIS — I160 Hypertensive urgency: Secondary | ICD-10-CM | POA: Insufficient documentation

## 2016-01-29 LAB — COMPREHENSIVE METABOLIC PANEL
ALBUMIN: 4.3 g/dL (ref 3.5–5.0)
ALT: 228 U/L — ABNORMAL HIGH (ref 17–63)
AST: 142 U/L — AB (ref 15–41)
Alkaline Phosphatase: 86 U/L (ref 38–126)
Anion gap: 8 (ref 5–15)
BUN: 16 mg/dL (ref 6–20)
CHLORIDE: 107 mmol/L (ref 101–111)
CO2: 24 mmol/L (ref 22–32)
Calcium: 9.8 mg/dL (ref 8.9–10.3)
Creatinine, Ser: 0.9 mg/dL (ref 0.61–1.24)
GFR calc Af Amer: >60 mL/min (ref >60)
GLUCOSE: 103 mg/dL — AB (ref 65–99)
POTASSIUM: 4.1 mmol/L (ref 3.5–5.1)
Sodium: 139 mmol/L (ref 135–145)
Total Bilirubin: 0.4 mg/dL (ref 0.3–1.2)
Total Protein: 7.5 g/dL (ref 6.5–8.1)

## 2016-01-29 LAB — CBC WITH DIFFERENTIAL/PLATELET
Basophils Absolute: 0.1 10*3/uL (ref 0.0–0.1)
Basophils Relative: 1 %
EOS PCT: 2 %
Eosinophils Absolute: 0.3 10*3/uL (ref 0.0–0.7)
HEMATOCRIT: 45 % (ref 39.0–52.0)
HEMOGLOBIN: 15.7 g/dL (ref 13.0–17.0)
LYMPHS ABS: 3 10*3/uL (ref 0.7–4.0)
LYMPHS PCT: 27 %
MCH: 30 pg (ref 26.0–34.0)
MCHC: 34.9 g/dL (ref 30.0–36.0)
MCV: 86 fL (ref 78.0–100.0)
Monocytes Absolute: 1.3 10*3/uL — ABNORMAL HIGH (ref 0.1–1.0)
Monocytes Relative: 12 %
NEUTROS ABS: 6.4 10*3/uL (ref 1.7–7.7)
Neutrophils Relative %: 58 %
Platelets: 260 10*3/uL (ref 150–400)
RBC: 5.23 MIL/uL (ref 4.22–5.81)
RDW: 12.6 % (ref 11.5–15.5)
WBC: 11 10*3/uL — AB (ref 4.0–10.5)

## 2016-01-29 LAB — URINALYSIS, ROUTINE W REFLEX MICROSCOPIC
Bilirubin Urine: NEGATIVE
Glucose, UA: NEGATIVE mg/dL
Hgb urine dipstick: NEGATIVE
Ketones, ur: NEGATIVE mg/dL
LEUKOCYTES UA: NEGATIVE
NITRITE: NEGATIVE
PH: 7 (ref 5.0–8.0)
Protein, ur: NEGATIVE mg/dL
SPECIFIC GRAVITY, URINE: 1.018 (ref 1.005–1.030)

## 2016-01-29 NOTE — ED Notes (Signed)
Pt c/o central chest pressure that started at 1700 today and gradually got worse with radiating to left chest and left shoulder.  Pt's pain is worse with movement.

## 2016-01-29 NOTE — ED Triage Notes (Signed)
Pt c/o chest pain that radiate across to left arm and is exacerbated by moving left arm. Pain started while at work.

## 2016-01-29 NOTE — ED Notes (Signed)
Pt verbalizes understanding of d/c instructions and denies any further needs at this time. 

## 2016-01-29 NOTE — ED Triage Notes (Signed)
Pt reports missing 2 days of BP medication, but took a dose x 2 days ago
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
## 2016-01-29 NOTE — Discharge Instructions (Signed)
As discussed, today's evaluation is largely reassuring, but is important to recall that you need to follow-up with both your primary care physician and your gastroenterologist.  Please be sure to take her medication as directed.  Return here for concerning changes in your condition.

## 2016-01-29 NOTE — Telephone Encounter (Signed)
Caller name: Relationship to patient: Can be reached: [phone] Pharmacy:  Reason for call: Mom Clydie Braun(Karen) called to schedule an appt for patient to have a BP check so that he can get his ADHD medication. States he was seen in the ER last night. Offered her an ER follow up with Ramon DredgeEdward but she declined. Stated she did not want patient to see Ramon DredgeEdward. Wants to know if a nurse visit for a BP check will be good enough to get his refills. Plse adv

## 2016-01-30 ENCOUNTER — Ambulatory Visit: Payer: Self-pay | Admitting: Medical

## 2016-01-30 NOTE — Telephone Encounter (Signed)
Since I saw him in October am willing to let him do a nurse bp check if normal can restart ADD meds but would need an appt before the first month of med was done to check his pressure. Once bp is checked then can proceed

## 2016-01-30 NOTE — Telephone Encounter (Signed)
Spoke to the mom and scheduled a nurse visit appt. For BP Check.

## 2016-01-31 ENCOUNTER — Telehealth: Payer: Self-pay | Admitting: Family Medicine

## 2016-01-31 NOTE — Telephone Encounter (Signed)
I need to see him so let's do that and find an appt for mom in the following month

## 2016-01-31 NOTE — Telephone Encounter (Signed)
Patient's mother called stating that she would like patient to be seen by Dr. Abner GreenspanBlyth for an ED follow up. She states that his medications are not controlling his BP and he is really not feeling well. She is willing to give up her appointment on 02/04/16 so that he can come in. Her appointment is at 4:45 and is a 15 min slot. I can put two appointments together at that time if Dr. Abner GreenspanBlyth needs to see the patient. Please advise   Phone: [phone]

## 2016-02-03 NOTE — Telephone Encounter (Signed)
Appointment scheduled for 02/04/16 at 4:00

## 2016-02-04 ENCOUNTER — Other Ambulatory Visit: Payer: Self-pay | Admitting: Family Medicine

## 2016-02-04 ENCOUNTER — Encounter: Payer: Self-pay | Admitting: Family Medicine

## 2016-02-04 ENCOUNTER — Ambulatory Visit (INDEPENDENT_AMBULATORY_CARE_PROVIDER_SITE_OTHER): Payer: 59 | Admitting: Family Medicine

## 2016-02-04 VITALS — BP 138/78 | HR 96 | Temp 98.5°F | Ht 75.0 in | Wt 330.0 lb

## 2016-02-04 DIAGNOSIS — I10 Essential (primary) hypertension: Secondary | ICD-10-CM | POA: Diagnosis not present

## 2016-02-04 DIAGNOSIS — R739 Hyperglycemia, unspecified: Secondary | ICD-10-CM | POA: Diagnosis not present

## 2016-02-04 DIAGNOSIS — F411 Generalized anxiety disorder: Secondary | ICD-10-CM

## 2016-02-04 DIAGNOSIS — R7989 Other specified abnormal findings of blood chemistry: Secondary | ICD-10-CM | POA: Diagnosis not present

## 2016-02-04 DIAGNOSIS — E782 Mixed hyperlipidemia: Secondary | ICD-10-CM

## 2016-02-04 DIAGNOSIS — R945 Abnormal results of liver function studies: Secondary | ICD-10-CM

## 2016-02-04 MED ORDER — METOPROLOL SUCCINATE ER 100 MG PO TB24: 100.0000 mg | ORAL_TABLET | Freq: Every day | ORAL | 1 refills | Status: DC

## 2016-02-04 NOTE — Progress Notes (Signed)
Subjective:    Patient ID: Scott Pope, male    DOB: January 17, 1991, 25 y.o.   MRN: [id number]  Chief Complaint  Patient presents with  . Follow-up    Hospital F/U.    HPI Patient is in today for hospital follow up. Was seen in the Hospital on 01/29/2016. Has experienced some blurred vision, headaches and chest pain. He acknowledges he has been experiencing increased stress at work lately. His work up in ER was negative for any cardiac cause. He has not had any recurrent symptoms since his visit to ED. His BP was notably elevated in ED. Denies palp/SOB/congestion/fevers/GI or GU c/o. Taking meds as prescribed  Past Medical History:  Diagnosis Date  . Abdominal pain, epigastric 04/04/2015  . Acute low back pain 07/07/2010  . ADHD (attention deficit hyperactivity disorder) 07/07/2010  . Allergic state 09/04/2010  . Benign essential HTN 01/14/2009   Qualifier: Diagnosis of  By: Caryl Never MD, Bruce    . 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 Cough 04/06/2011  . Depression   . ELEVATED BLOOD PRESSURE 01/14/2009  . GERD 01/14/2009  . Hyperglycemia 10/11/2015  . Hyperlipidemia, mixed 10/11/2015  . IBS (irritable bowel syndrome) 08/25/2013  . Insomnia 08/04/2010  . Knee pain, left 09/24/2011  . Otitis media 11/24/2011  . Otitis media of left ear 12/09/2010  . Overweight(278.02) 07/07/2010  . Preventative health care 11/22/2012  . RUQ pain 04/04/2015  . Skin lesion of left leg 04/01/2011  . SOB (shortness of breath) on exertion 03/06/2011  . 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 Tick bite 10/17/2012  . Tinea corporis 11/22/2012  . Viral gastroenteritis 10/17/2010    Past Surgical History:  Procedure Laterality Date  . CIRCUMCISION  AT 25YRS OLD   HAD WILD REACTION TO SEDATION MED GIVEN  . THYROGLOSSAL DUCT CYST     excised at 25 years of age  . TONSILLECTOMY AND ADENOIDECTOMY    . TYMPANOSTOMY TUBE PLACEMENT     multiple     Family History  Problem Relation Age of Onset  . Obesity Mother     . Depression Mother   . Fibromyalgia Mother   . Hypertension Mother   . Chronic fatigue Mother   . Hypertension Father   . Fibromyalgia Maternal Grandmother   . Other Maternal Grandmother     reflux  . Other Maternal Grandfather     Lems disease  . Hypertension Paternal Grandfather   . Arthritis Paternal Grandfather   . Gallbladder disease      unknown  . Colon cancer Neg Hx   . Esophageal cancer Neg Hx   . Stomach cancer Neg Hx   . Pancreatic cancer Neg Hx   . Colon polyps Neg Hx     Social History   Social History  . Marital status: Single    Spouse name: N/A  . Number of children: N/A  . Years of education: N/A   Occupational History  . Not on file.   Social History Main Topics  . Smoking status: Never Smoker  . Smokeless tobacco: Never Used  . Alcohol use No  . Drug use: No  . Sexual activity: No   Other Topics Concern  . Not on file   Social History Narrative  . No narrative on file    Outpatient Medications Prior to Visit  Medication Sig Dispense Refill  . HYDROcodone-acetaminophen (NORCO) 5-325 MG tablet Take 1-2 tablets by mouth every 6 (six) hours as needed (for pain). 10 tablet 0  .  lisdexamfetamine (VYVANSE) 50 MG capsule Take 1 capsule (50 mg total) by mouth daily. October 2017 30 capsule 0  . lisdexamfetamine (VYVANSE) 50 MG capsule Take 1 capsule (50 mg total) by mouth daily. August . 2017 30 capsule 0  . lisdexamfetamine (VYVANSE) 50 MG capsule Take 1 capsule (50 mg total) by mouth daily. December  2017 30 capsule 0  . pantoprazole (PROTONIX) 40 MG tablet TAKE 1 TABLET (40 MG TOTAL) BY MOUTH DAILY. 30 tablet 2  . metoprolol succinate (TOPROL-XL) 50 MG 24 hr tablet TAKE 1 TABLET (50 MG TOTAL) BY MOUTH DAILY. TAKE WITH OR IMMEDIATELY FOLLOWING A MEAL. 30 tablet 3   No facility-administered medications prior to visit.     Allergies  Allergen Reactions  . Strattera [Atomoxetine Hcl]     Suicidal thoughts, depression    Review of Systems   Constitutional: Negative for fever.  Eyes: Positive for blurred vision.  Respiratory: Negative for cough and shortness of breath.   Cardiovascular: Positive for chest pain.  Gastrointestinal: Negative for vomiting.  Musculoskeletal: Negative for back pain.  Skin: Negative for rash.  Neurological: Positive for headaches. Negative for loss of consciousness.       Objective:    Physical Exam  Constitutional: He is oriented to person, place, and time. He appears well-developed and well-nourished. No distress.  HENT:  Head: Normocephalic and atraumatic.  Eyes: Conjunctivae are normal.  Neck: Normal range of motion. No thyromegaly present.  Cardiovascular: Normal rate and regular rhythm.   Pulmonary/Chest: Effort normal and breath sounds normal. He has no wheezes.  Abdominal: Soft. Bowel sounds are normal. There is no tenderness.  Musculoskeletal: He exhibits no edema or deformity.  Neurological: He is alert and oriented to person, place, and time.  Skin: Skin is warm and dry. He is not diaphoretic.  Psychiatric: He has a normal mood and affect.    BP 138/78 (BP Location: Left Arm, Patient Position: Sitting, Cuff Size: Large)   Pulse 96   Temp 98.5 F (36.9 C) (Oral)   Ht 6\' 3"  (1.905 m)   Wt (!) 330 lb (149.7 kg)   SpO2 97% Comment: RA  BMI 41.25 kg/m  Wt Readings from Last 3 Encounters:  02/04/16 (!) 330 lb (149.7 kg)  01/29/16 (!) 328 lb (148.8 kg)  12/05/15 (!) 320 lb (145.2 kg)     Lab Results  Component Value Date   WBC 11.0 (H) 01/29/2016   HGB 15.7 01/29/2016   HCT 45.0 01/29/2016   PLT 260 01/29/2016   GLUCOSE 103 (H) 01/29/2016   CHOL 173 10/11/2015   TRIG 197.0 (H) 10/11/2015   HDL 39.70 10/11/2015   LDLCALC 94 10/11/2015   ALT 228 (H) 01/29/2016   AST 142 (H) 01/29/2016   NA 139 01/29/2016   K 4.1 01/29/2016   CL 107 01/29/2016   CREATININE 0.90 01/29/2016   BUN 16 01/29/2016   CO2 24 01/29/2016   TSH 1.67 10/11/2015   HGBA1C 5.2 10/11/2015     Lab Results  Component Value Date   TSH 1.67 10/11/2015   Lab Results  Component Value Date   WBC 11.0 (H) 01/29/2016   HGB 15.7 01/29/2016   HCT 45.0 01/29/2016   MCV 86.0 01/29/2016   PLT 260 01/29/2016   Lab Results  Component Value Date   NA 139 01/29/2016   K 4.1 01/29/2016   CO2 24 01/29/2016   GLUCOSE 103 (H) 01/29/2016   BUN 16 01/29/2016   CREATININE 0.90 01/29/2016  BILITOT 0.4 01/29/2016   ALKPHOS 86 01/29/2016   AST 142 (H) 01/29/2016   ALT 228 (H) 01/29/2016   PROT 7.5 01/29/2016   ALBUMIN 4.3 01/29/2016   CALCIUM 9.8 01/29/2016   ANIONGAP 8 01/29/2016   GFR 101.89 10/11/2015   Lab Results  Component Value Date   CHOL 173 10/11/2015   Lab Results  Component Value Date   HDL 39.70 10/11/2015   Lab Results  Component Value Date   LDLCALC 94 10/11/2015   Lab Results  Component Value Date   TRIG 197.0 (H) 10/11/2015   Lab Results  Component Value Date   CHOLHDL 4 10/11/2015   Lab Results  Component Value Date   HGBA1C 5.2 10/11/2015      I acted as a Neurosurgeonscribe for Dr. Abner GreenspanBlyth. Diamond NickelBeatrice, CMA  Assessment & Plan:   Problem List Items Addressed This Visit    Anxiety state    Patient was recently seen in ED with atypical chest pain, blurry vision, lightheadedness and headache. Work up was unremarkable for cardiac work up but blood pressure was elevated. Symptoms c/w anxiety and attack and patient endorses increased stressors at work. Will have him hold his Vyvanse for now and report if symptoms persist.       Benign essential HTN    Mild elevation today but notably elevated in hospital recently, will increase Metoprolol Xr to 100 mg daily.       Relevant Medications   metoprolol succinate (TOPROL-XL) 100 MG 24 hr tablet   Hyperlipidemia, mixed    Encouraged heart healthy diet, increase exercise, avoid trans fats, consider a krill oil cap daily      Relevant Medications   metoprolol succinate (TOPROL-XL) 100 MG 24 hr tablet    Hyperglycemia     minimize simple carbs. Increase exercise as tolerated.       Other Visit Diagnoses    Essential hypertension    -  Primary   Relevant Medications   metoprolol succinate (TOPROL-XL) 100 MG 24 hr tablet   Other Relevant Orders   Ambulatory referral to Cardiology   TSH   CBC   Comprehensive metabolic panel   Elevated liver function tests       Relevant Orders   Hepatitis, Acute      I have discontinued Mr. Towles's metoprolol succinate. I am also having him start on metoprolol succinate. Additionally, I am having him maintain his lisdexamfetamine, lisdexamfetamine, HYDROcodone-acetaminophen, lisdexamfetamine, and pantoprazole.  Meds ordered this encounter  Medications  . metoprolol succinate (TOPROL-XL) 100 MG 24 hr tablet    Sig: Take 1 tablet (100 mg total) by mouth daily. Take with or immediately following a meal.    Dispense:  90 tablet    Refill:  1     Lillie Portner, MD

## 2016-02-04 NOTE — Progress Notes (Signed)
Pre visit review using our clinic review tool, if applicable. No additional management support is needed unless otherwise documented below in the visit note. 

## 2016-02-04 NOTE — Progress Notes (Signed)
Patient ID: Joycie PeekSpencer T Pope, male   DOB: 03/16/1990, 25 y.o.   MRN: [id number]

## 2016-02-04 NOTE — Patient Instructions (Signed)
Generalized Anxiety Disorder Generalized anxiety disorder (GAD) is a mental disorder. It interferes with life functions, including relationships, work, and school. GAD is different from normal anxiety, which everyone experiences at some point in their lives in response to specific life events and activities. Normal anxiety actually helps us prepare for and get through these life events and activities. Normal anxiety goes away after the event or activity is over.  GAD causes anxiety that is not necessarily related to specific events or activities. It also causes excess anxiety in proportion to specific events or activities. The anxiety associated with GAD is also difficult to control. GAD can vary from mild to severe. People with severe GAD can have intense waves of anxiety with physical symptoms (panic attacks).  SYMPTOMS The anxiety and worry associated with GAD are difficult to control. This anxiety and worry are related to many life events and activities and also occur more days than not for 6 months or longer. People with GAD also have three or more of the following symptoms (one or more in children):  Restlessness.   Fatigue.  Difficulty concentrating.   Irritability.  Muscle tension.  Difficulty sleeping or unsatisfying sleep. DIAGNOSIS GAD is diagnosed through an assessment by your health care provider. Your health care provider will ask you questions aboutyour mood,physical symptoms, and events in your life. Your health care provider may ask you about your medical history and use of alcohol or drugs, including prescription medicines. Your health care provider may also do a physical exam and blood tests. Certain medical conditions and the use of certain substances can cause symptoms similar to those associated with GAD. Your health care provider may refer you to a mental health specialist for further evaluation. TREATMENT The following therapies are usually used to treat GAD:    Medication. Antidepressant medication usually is prescribed for long-term daily control. Antianxiety medicines may be added in severe cases, especially when panic attacks occur.   Talk therapy (psychotherapy). Certain types of talk therapy can be helpful in treating GAD by providing support, education, and guidance. A form of talk therapy called cognitive behavioral therapy can teach you healthy ways to think about and react to daily life events and activities.  Stress managementtechniques. These include yoga, meditation, and exercise and can be very helpful when they are practiced regularly. A mental health specialist can help determine which treatment is best for you. Some people see improvement with one therapy. However, other people require a combination of therapies. This information is not intended to replace advice given to you by your health care provider. Make sure you discuss any questions you have with your health care provider. Document Released: 05/30/2012 Document Revised: 02/23/2014 Document Reviewed: 05/30/2012 Elsevier Interactive Patient Education  2017 Else[employer] Inc.  

## 2016-02-06 NOTE — Progress Notes (Deleted)
PAtient no show for scheduled BP Check today.

## 2016-02-19 NOTE — Assessment & Plan Note (Addendum)
Mild elevation today but notably elevated in hospital recently, will increase Metoprolol Xr to 100 mg daily.

## 2016-02-19 NOTE — Assessment & Plan Note (Signed)
Encouraged heart healthy diet, increase exercise, avoid trans fats, consider a krill oil cap daily 

## 2016-02-19 NOTE — Assessment & Plan Note (Addendum)
minimize simple carbs. Increase exercise as tolerated.  

## 2016-02-19 NOTE — Assessment & Plan Note (Signed)
Patient was recently seen in ED with atypical chest pain, blurry vision, lightheadedness and headache. Work up was unremarkable for cardiac work up but blood pressure was elevated. Symptoms c/w anxiety and attack and patient endorses increased stressors at work. Will have him hold his Vyvanse for now and report if symptoms persist.

## 2016-03-05 ENCOUNTER — Other Ambulatory Visit: Payer: Self-pay | Admitting: Family Medicine

## 2016-03-10 ENCOUNTER — Other Ambulatory Visit: Payer: Self-pay | Admitting: Family Medicine

## 2016-03-10 NOTE — Telephone Encounter (Signed)
He needs a blood pressure check on the vyvanse if he is out he can have 1 tab to take the day of visit I am just worried about his blood pressure

## 2016-03-10 NOTE — Telephone Encounter (Signed)
Sent a mychart message to schedule a nurse visit to check BP Called left detailed message as well.

## 2016-03-10 NOTE — Telephone Encounter (Signed)
Last refill 01/23/2016 Last office visit 02/04/2016 No UDS/No contract

## 2016-03-12 ENCOUNTER — Encounter: Payer: Self-pay | Admitting: Family Medicine

## 2016-03-12 ENCOUNTER — Other Ambulatory Visit: Payer: Self-pay | Admitting: Family Medicine

## 2016-03-12 MED ORDER — LISDEXAMFETAMINE DIMESYLATE 50 MG PO CAPS: 50.0000 mg | ORAL_CAPSULE | Freq: Every day | ORAL | 0 refills | Status: DC

## 2016-03-19 ENCOUNTER — Ambulatory Visit (INDEPENDENT_AMBULATORY_CARE_PROVIDER_SITE_OTHER): Payer: 59 | Admitting: Family Medicine

## 2016-03-19 VITALS — BP 149/86 | HR 97

## 2016-03-19 DIAGNOSIS — I10 Essential (primary) hypertension: Secondary | ICD-10-CM | POA: Diagnosis not present

## 2016-03-19 MED ORDER — LISDEXAMFETAMINE DIMESYLATE 40 MG PO CAPS: 40.0000 mg | ORAL_CAPSULE | ORAL | 0 refills | Status: DC

## 2016-03-19 NOTE — Progress Notes (Signed)
Pre visit review using our clinic review tool, if applicable. No additional management support is needed unless otherwise documented below in the visit note.  Patient came in clinic for blood pressure check per medication refill note 03/10/16. Verified medication & regimen. He reported that in addition to his daily Metoprolol, he had a [employer] earlier this morning due to lack of rest. PCP was made aware. Today's readings were: BP 136/93 P 95 & BP 149/86 P 97.  Per Dr. Abner GreenspanBlyth: Continue current medication & regimen. Return in 2-3 weeks for a nurse visit to have blood pressure rechecked. Please be sure to not consume any [employer] products prior to the appointment.  Patient was made aware of the provider's instructions and voiced understanding. Before leaving the visit, patient was given printed Rx for Vyvanse per medication refill request & PCP's approval.  Next appointment 04/10/16 at 2:45 PM.

## 2016-03-19 NOTE — Patient Instructions (Addendum)
Per Dr. Abner GreenspanBlyth: Continue current medication & regimen. Return in 2-3 weeks for a nurse visit to have blood pressure rechecked. Please be sure to not consume any [employer] products prior to the appointment.

## 2016-03-22 NOTE — Progress Notes (Signed)
RN blood pressure check note reviewed. Agree with documention and plan. 

## 2016-03-30 ENCOUNTER — Encounter: Payer: Self-pay | Admitting: Family Medicine

## 2016-04-02 ENCOUNTER — Telehealth: Payer: Self-pay | Admitting: Family Medicine

## 2016-04-02 NOTE — Telephone Encounter (Signed)
Submitted PA for Vyvanse through covermymeds. Awaiting response. [id number]

## 2016-04-02 NOTE — Telephone Encounter (Signed)
Received  PA Approval for Vyvanse From  04/02/2016  Through  04/02/2017. Called the mom/patient informed of approval.

## 2016-05-18 ENCOUNTER — Telehealth: Payer: Self-pay | Admitting: Family Medicine

## 2016-05-19 MED ORDER — LISDEXAMFETAMINE DIMESYLATE 40 MG PO CAPS: 40.0000 mg | ORAL_CAPSULE | ORAL | 0 refills | Status: DC

## 2016-05-19 NOTE — Telephone Encounter (Signed)
Rx printed, awaiting MD signature.  

## 2016-05-19 NOTE — Telephone Encounter (Signed)
Message   Requesting:  vyvanse  Contract  None  UDS  None  Last OV  03/19/2016  Last Refill  #30 no refills on 03/19/2016    Please Advise

## 2016-05-19 NOTE — Telephone Encounter (Signed)
Ok #30, no RF 

## 2016-05-19 NOTE — Telephone Encounter (Signed)
Pt informed via MyChart that Rx has been placed at front desk for pick up at his convenience.  

## 2016-05-31 ENCOUNTER — Other Ambulatory Visit: Payer: Self-pay | Admitting: Family Medicine

## 2016-08-05 ENCOUNTER — Other Ambulatory Visit: Payer: Self-pay | Admitting: Family Medicine

## 2016-08-05 MED ORDER — METOPROLOL SUCCINATE ER 100 MG PO TB24: 100.0000 mg | ORAL_TABLET | Freq: Every day | ORAL | 1 refills | Status: DC

## 2016-10-21 ENCOUNTER — Other Ambulatory Visit: Payer: Self-pay | Admitting: Internal Medicine

## 2016-10-22 MED ORDER — LISDEXAMFETAMINE DIMESYLATE 40 MG PO CAPS: 40.0000 mg | ORAL_CAPSULE | ORAL | 0 refills | Status: DC

## 2016-10-22 NOTE — Telephone Encounter (Signed)
Pt is requesting refill on Vyvanse 40mg .   Last OV: 03/19/2016  Last Fill: 05/19/2016 #30 and 0RF UDS: None seen   Please advise.

## 2016-10-22 NOTE — Telephone Encounter (Signed)
Rx printed, awaiting MD signature. Dr. Abner GreenspanBlyth informed that we are out of UDS supplies at this time; okay per Dr. Abner GreenspanBlyth to get at next opportunity. Pt informed via MyChart that Rx has been placed at front desk for pick up.

## 2016-10-22 NOTE — Telephone Encounter (Signed)
This is his last refill til he is seen again and I need a UDS

## 2016-11-03 DIAGNOSIS — Z23 Encounter for immunization: Secondary | ICD-10-CM | POA: Diagnosis not present

## 2016-11-04 ENCOUNTER — Encounter: Payer: Self-pay | Admitting: Family Medicine

## 2016-11-04 ENCOUNTER — Telehealth: Payer: Self-pay | Admitting: Family Medicine

## 2016-11-04 NOTE — Telephone Encounter (Signed)
Noted  

## 2016-11-04 NOTE — Telephone Encounter (Signed)
Called patient who states pain is better and swelling has gone down in arm. States his arm is much better and does not feel he needs to be seen at this time. Advised patient to call office or go to ED if condition worsens. Patient agreed.

## 2016-11-04 NOTE — Telephone Encounter (Signed)
Can you triage this please? 

## 2016-11-04 NOTE — Telephone Encounter (Signed)
error:[id number] ° °

## 2016-11-04 NOTE — Telephone Encounter (Signed)
[name] called [phone] with pt there. Pt had flu shot [facility] yesterday 11/03/16. Pt slept with ice pack last night states cant raise arm above head and arm swelling and pain radiates to finger tips. Offered appt to come today but pt prefers phone call of advice.

## 2017-01-12 ENCOUNTER — Other Ambulatory Visit: Payer: Self-pay | Admitting: Family Medicine

## 2017-01-12 NOTE — Telephone Encounter (Signed)
Copied from CRM [id number]. Topic: Quick Communication - See Telephone Encounter >> Jan 12, 2017  3:58 PM Landry MellowFoltz, Melissa J wrote: CRM for notification. See Telephone encounter for:   01/12/17.pt needs refill of lisdexamfetamine (VYVANSE) 40 MG capsule. Pt states that it is 45 mg, though. Please call [phone] when ready for pick up  / Medication refill for Vyvanse / LOV 03/19/16 with Dr. Rogelia RohrerBlythe

## 2017-01-14 NOTE — Addendum Note (Signed)
Addended by: Crissie SicklesARTER, Adis Sturgill A on: 01/14/2017 11:17 AM   Modules accepted: Orders

## 2017-01-14 NOTE — Telephone Encounter (Signed)
Requesting:vyvanse  Contract:yes UDS:no Last OV:03/19/16 Next OV:n/a Last Refill:10/22/16   #[id number]   Please advise

## 2017-01-14 NOTE — Telephone Encounter (Signed)
No more refills til seen

## 2017-01-14 NOTE — Addendum Note (Signed)
Addended by: Crissie SicklesARTER, Yaqub Arney A on: 01/14/2017 04:32 PM   Modules accepted: Orders

## 2017-01-14 NOTE — Telephone Encounter (Signed)
Patient placed on scheduled for 02/05/17   She will call and see if he can keep the appointment

## 2017-01-19 ENCOUNTER — Other Ambulatory Visit: Payer: Self-pay

## 2017-01-19 MED ORDER — LISDEXAMFETAMINE DIMESYLATE 40 MG PO CAPS: 40.0000 mg | ORAL_CAPSULE | ORAL | 0 refills | Status: DC

## 2017-01-19 MED FILL — VYVANSE 40 MG CAPSULE: 40 | 7 days supply | Qty: 7 | Fill #0

## 2017-01-22 ENCOUNTER — Encounter: Payer: Self-pay | Admitting: Family Medicine

## 2017-01-22 ENCOUNTER — Ambulatory Visit: Payer: 59 | Admitting: Family Medicine

## 2017-01-22 VITALS — BP 124/89 | HR 68 | Temp 98.0°F | Resp 18 | Ht 75.2 in | Wt 330.8 lb

## 2017-01-22 DIAGNOSIS — E669 Obesity, unspecified: Secondary | ICD-10-CM

## 2017-01-22 DIAGNOSIS — E782 Mixed hyperlipidemia: Secondary | ICD-10-CM | POA: Diagnosis not present

## 2017-01-22 DIAGNOSIS — Z79899 Other long term (current) drug therapy: Secondary | ICD-10-CM

## 2017-01-22 DIAGNOSIS — R7989 Other specified abnormal findings of blood chemistry: Secondary | ICD-10-CM

## 2017-01-22 DIAGNOSIS — M545 Low back pain: Secondary | ICD-10-CM

## 2017-01-22 DIAGNOSIS — R945 Abnormal results of liver function studies: Secondary | ICD-10-CM | POA: Diagnosis not present

## 2017-01-22 DIAGNOSIS — R739 Hyperglycemia, unspecified: Secondary | ICD-10-CM | POA: Diagnosis not present

## 2017-01-22 DIAGNOSIS — I10 Essential (primary) hypertension: Secondary | ICD-10-CM

## 2017-01-22 DIAGNOSIS — F909 Attention-deficit hyperactivity disorder, unspecified type: Secondary | ICD-10-CM

## 2017-01-22 DIAGNOSIS — K219 Gastro-esophageal reflux disease without esophagitis: Secondary | ICD-10-CM

## 2017-01-22 LAB — COMPREHENSIVE METABOLIC PANEL
ALBUMIN: 4.3 g/dL (ref 3.5–5.2)
ALK PHOS: 78 U/L (ref 39–117)
ALT: 109 U/L — AB (ref 0–53)
AST: 73 U/L — AB (ref 0–37)
BILIRUBIN TOTAL: 0.6 mg/dL (ref 0.2–1.2)
BUN: 14 mg/dL (ref 6–23)
CO2: 28 mEq/L (ref 19–32)
CREATININE: 0.9 mg/dL (ref 0.40–1.50)
Calcium: 9.4 mg/dL (ref 8.4–10.5)
Chloride: 102 mEq/L (ref 96–112)
GFR: 109.88 mL/min (ref >60.00)
Glucose, Bld: 81 mg/dL (ref 70–99)
Potassium: 4.1 mEq/L (ref 3.5–5.1)
SODIUM: 139 meq/L (ref 135–145)
TOTAL PROTEIN: 7 g/dL (ref 6.0–8.3)

## 2017-01-22 LAB — CBC
HCT: 47.3 % (ref 39.0–52.0)
Hemoglobin: 16 g/dL (ref 13.0–17.0)
MCHC: 33.9 g/dL (ref 30.0–36.0)
MCV: 88 fl (ref 78.0–100.0)
PLATELETS: 298 10*3/uL (ref 150.0–400.0)
RBC: 5.37 Mil/uL (ref 4.22–5.81)
RDW: 12.9 % (ref 11.5–15.5)
WBC: 8.3 10*3/uL (ref 4.0–10.5)

## 2017-01-22 LAB — LIPID PANEL
CHOLESTEROL: 148 mg/dL (ref 0–200)
HDL: 36.6 mg/dL — ABNORMAL LOW (ref >39.00)
LDL CALC: 80 mg/dL (ref 0–99)
NonHDL: 111.27
Total CHOL/HDL Ratio: 4
Triglycerides: 155 mg/dL — ABNORMAL HIGH (ref 0.0–149.0)
VLDL: 31 mg/dL (ref 0.0–40.0)

## 2017-01-22 LAB — HEMOGLOBIN A1C: Hgb A1c MFr Bld: 5.2 % (ref 4.6–6.5)

## 2017-01-22 LAB — TSH: TSH: 1.95 u[IU]/mL (ref 0.35–4.50)

## 2017-01-22 MED ORDER — LISDEXAMFETAMINE DIMESYLATE 40 MG PO CAPS: 40.0000 mg | ORAL_CAPSULE | ORAL | 0 refills | Status: DC

## 2017-01-22 NOTE — Assessment & Plan Note (Signed)
Vyvanse is refilled today

## 2017-01-22 NOTE — Assessment & Plan Note (Signed)
Encouraged heart healthy diet, increase exercise, avoid trans fats, consider a krill oil cap daily 

## 2017-01-22 NOTE — Assessment & Plan Note (Signed)
Well controlled, no changes to meds. Encouraged heart healthy diet such as the DASH diet and exercise as tolerated.  °

## 2017-01-22 NOTE — Progress Notes (Signed)
Subjective:  I acted as a Neurosurgeon for [employer] Fuller Song, RMA   Patient ID: Scott Pope, male    DOB: 06-21-1990, 26 y.o.   MRN: [id number]  Chief Complaint  Patient presents with  . Follow-up    HPI  Patient is in today for follow up visit and he reports he is doing well.  He denies any recent illness or hospitalization.  He does continue to have trouble sleeping both falling asleep and staying asleep.  Also has intermittent trouble with low back pain but no radicular symptoms or incontinence.  No pain at today's visit.  He is tolerating his Vyvanse and denies any palpitations or concerning side effects. Denies CP/palp/SOB/HA/congestion/fevers/GI or GU c/o. Taking meds as prescribed  Patient Care Team: Bradd Canary, MD as PCP - General (Family Medicine)   Past Medical History:  Diagnosis Date  . Abdominal pain, epigastric 04/04/2015  . Acute low back pain 07/07/2010  . ADHD (attention deficit hyperactivity disorder) 07/07/2010  . Allergic state 09/04/2010  . Benign essential HTN 01/14/2009   Qualifier: Diagnosis of  By: Caryl Never MD, Bruce    . 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 Cough 04/06/2011  . Depression   . ELEVATED BLOOD PRESSURE 01/14/2009  . GERD 01/14/2009  . Hyperglycemia 10/11/2015  . Hyperlipidemia, mixed 10/11/2015  . IBS (irritable bowel syndrome) 08/25/2013  . Insomnia 08/04/2010  . Knee pain, left 09/24/2011  . Otitis media 11/24/2011  . Otitis media of left ear 12/09/2010  . Overweight(278.02) 07/07/2010  . Preventative health care 11/22/2012  . RUQ pain 04/04/2015  . Skin lesion of left leg 04/01/2011  . SOB (shortness of breath) on exertion 03/06/2011  . 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 Tick bite 10/17/2012  . Tinea corporis 11/22/2012  . Viral gastroenteritis 10/17/2010    Past Surgical History:  Procedure Laterality Date  . CIRCUMCISION  AT 26YRS OLD   HAD WILD REACTION TO SEDATION MED GIVEN  . THYROGLOSSAL DUCT CYST     excised at 26 years of age    . TONSILLECTOMY AND ADENOIDECTOMY    . TYMPANOSTOMY TUBE PLACEMENT     multiple     Family History  Problem Relation Age of Onset  . Obesity Mother   . Depression Mother   . Fibromyalgia Mother   . Hypertension Mother   . Chronic fatigue Mother   . Hypertension Father   . Fibromyalgia Maternal Grandmother   . Other Maternal Grandmother        reflux  . Other Maternal Grandfather        Lems disease  . Hypertension Paternal Grandfather   . Arthritis Paternal Grandfather   . Gallbladder disease Unknown        unknown  . Colon cancer Neg Hx   . Esophageal cancer Neg Hx   . Stomach cancer Neg Hx   . Pancreatic cancer Neg Hx   . Colon polyps Neg Hx     Social History   Socioeconomic History  . Marital status: Single    Spouse name: Not on file  . Number of children: Not on file  . Years of education: Not on file  . Highest education level: Not on file  Social Needs  . Financial resource strain: Not on file  . Food insecurity - worry: Not on file  . Food insecurity - inability: Not on file  . Transportation needs - medical: Not on file  . Transportation needs - non-medical: Not on file  Occupational  History  . Not on file  Tobacco Use  . Smoking status: Never Smoker  . Smokeless tobacco: Never Used  Substance and Sexual Activity  . Alcohol use: No    Alcohol/week: 0.0 oz  . Drug use: No  . Sexual activity: No  Other Topics Concern  . Not on file  Social History Narrative  . Not on file    Outpatient Medications Prior to Visit  Medication Sig Dispense Refill  . metoprolol succinate (TOPROL-XL) 100 MG 24 hr tablet Take 1 tablet (100 mg total) by mouth daily. Take with or immediately following a meal. 90 tablet 1  . pantoprazole (PROTONIX) 40 MG tablet TAKE 1 TABLET (40 MG TOTAL) BY MOUTH DAILY. 30 tablet 5  . HYDROcodone-acetaminophen (NORCO) 5-325 MG tablet Take 1-2 tablets by mouth every 6 (six) hours as needed (for pain). 10 tablet 0  . lisdexamfetamine  (VYVANSE) 40 MG capsule Take 1 capsule (40 mg total) by mouth every morning. 7 capsule 0   No facility-administered medications prior to visit.     Allergies  Allergen Reactions  . Strattera [Atomoxetine Hcl]     Suicidal thoughts, depression    Review of Systems  Constitutional: Negative for fever and malaise/fatigue.  HENT: Negative for congestion.   Eyes: Negative for blurred vision.  Respiratory: Negative for shortness of breath.   Cardiovascular: Negative for chest pain, palpitations and leg swelling.  Gastrointestinal: Negative for abdominal pain, blood in stool and nausea.  Genitourinary: Negative for dysuria and frequency.  Musculoskeletal: Positive for joint pain. Negative for falls.  Skin: Negative for rash.  Neurological: Negative for dizziness, loss of consciousness and headaches.  Endo/Heme/Allergies: Negative for environmental allergies.  Psychiatric/Behavioral: Negative for depression. The patient has insomnia. The patient is not nervous/anxious.        Objective:    Physical Exam  Constitutional: He is oriented to person, place, and time. He appears well-developed and well-nourished. No distress.  HENT:  Head: Normocephalic and atraumatic.  Nose: Nose normal.  Eyes: Right eye exhibits no discharge. Left eye exhibits no discharge.  Neck: Normal range of motion. Neck supple.  Cardiovascular: Normal rate and regular rhythm.  No murmur heard. Pulmonary/Chest: Effort normal and breath sounds normal.  Abdominal: Soft. Bowel sounds are normal. There is no tenderness.  Musculoskeletal: He exhibits no edema.  Neurological: He is alert and oriented to person, place, and time.  Skin: Skin is warm and dry.  Psychiatric: He has a normal mood and affect.  Nursing note and vitals reviewed.   BP 124/89 (BP Location: Left Arm, Patient Position: Sitting, Cuff Size: Large)   Pulse 68   Temp 98 F (36.7 C) (Oral)   Resp 18   Ht 6' 3.2" (1.91 m)   Wt (!) 330 lb 12.8 oz  (150 kg)   SpO2 98%   BMI 41.13 kg/m  Wt Readings from Last 3 Encounters:  01/22/17 (!) 330 lb 12.8 oz (150 kg)  02/04/16 (!) 330 lb (149.7 kg)  01/29/16 (!) 328 lb (148.8 kg)   BP Readings from Last 3 Encounters:  01/22/17 124/89  03/19/16 (!) 149/86  02/04/16 138/78     Immunization History  Administered Date(s) Administered  . DTaP 09/14/2005  . Influenza Split 01/20/2011, 12/12/2014  . Influenza Whole 11/18/2009  . Influenza,inj,Quad PF,6+ Mos 11/22/2012, 11/09/2013  . Influenza-Unspecified 09/25/2015  . Td 02/17/1999  . Tdap 11/22/2012    Health Maintenance  Topic Date Due  . TETANUS/TDAP  11/23/2022  . INFLUENZA VACCINE  Completed  . HIV Screening  Completed    Lab Results  Component Value Date   WBC 8.3 01/22/2017   HGB 16.0 01/22/2017   HCT 47.3 01/22/2017   PLT 298.0 01/22/2017   GLUCOSE 81 01/22/2017   CHOL 148 01/22/2017   TRIG 155.0 (H) 01/22/2017   HDL 36.60 (L) 01/22/2017   LDLCALC 80 01/22/2017   ALT 109 (H) 01/22/2017   AST 73 (H) 01/22/2017   NA 139 01/22/2017   K 4.1 01/22/2017   CL 102 01/22/2017   CREATININE 0.90 01/22/2017   BUN 14 01/22/2017   CO2 28 01/22/2017   TSH 1.95 01/22/2017   HGBA1C 5.2 01/22/2017    Lab Results  Component Value Date   TSH 1.95 01/22/2017   Lab Results  Component Value Date   WBC 8.3 01/22/2017   HGB 16.0 01/22/2017   HCT 47.3 01/22/2017   MCV 88.0 01/22/2017   PLT 298.0 01/22/2017   Lab Results  Component Value Date   NA 139 01/22/2017   K 4.1 01/22/2017   CO2 28 01/22/2017   GLUCOSE 81 01/22/2017   BUN 14 01/22/2017   CREATININE 0.90 01/22/2017   BILITOT 0.6 01/22/2017   ALKPHOS 78 01/22/2017   AST 73 (H) 01/22/2017   ALT 109 (H) 01/22/2017   PROT 7.0 01/22/2017   ALBUMIN 4.3 01/22/2017   CALCIUM 9.4 01/22/2017   ANIONGAP 8 01/29/2016   GFR 109.88 01/22/2017   Lab Results  Component Value Date   CHOL 148 01/22/2017   Lab Results  Component Value Date   HDL 36.60 (L) 01/22/2017     Lab Results  Component Value Date   LDLCALC 80 01/22/2017   Lab Results  Component Value Date   TRIG 155.0 (H) 01/22/2017   Lab Results  Component Value Date   CHOLHDL 4 01/22/2017   Lab Results  Component Value Date   HGBA1C 5.2 01/22/2017         Assessment & Plan:   Problem List Items Addressed This Visit    GERD    Avoid offending foods, start probiotics. Do not eat large meals in late evening and consider raising head of bed.       Benign essential HTN    Well controlled, no changes to meds. Encouraged heart healthy diet such as the DASH diet and exercise as tolerated.       Relevant Orders   CBC (Completed)   Comprehensive metabolic panel (Completed)   TSH (Completed)   Obesity    Encouraged DASH diet, decrease po intake and increase exercise as tolerated. Needs 7-8 hours of sleep nightly.       Relevant Medications   lisdexamfetamine (VYVANSE) 40 MG capsule   lisdexamfetamine (VYVANSE) 40 MG capsule   lisdexamfetamine (VYVANSE) 40 MG capsule   ADHD (attention deficit hyperactivity disorder)    Vyvanse is refilled today      Low back pain    Encouraged moist heat and gentle stretching as tolerated. May try NSAIDs and prescription meds as directed and report if symptoms worsen or seek immediate care, try Lidcocaine patch      Hyperlipidemia, mixed    Encouraged heart healthy diet, increase exercise, avoid trans fats, consider a krill oil cap daily      Relevant Orders   Lipid panel (Completed)   Hyperglycemia    hgba1c acceptable, minimize simple carbs. Increase exercise as tolerated. Continue current meds      Relevant Orders   Hemoglobin A1c (Completed)  Abnormal liver function tests    Other Visit Diagnoses    High risk medication use    -  Primary   Relevant Orders   Pain Mgmt, Profile 8 w/Conf, U (Completed)      I have discontinued Robel T. Kimble's HYDROcodone-acetaminophen. I have also changed his lisdexamfetamine.  Additionally, I am having him start on lisdexamfetamine and lisdexamfetamine. Lastly, I am having him maintain his pantoprazole and metoprolol succinate.  Meds ordered this encounter  Medications  . lisdexamfetamine (VYVANSE) 40 MG capsule    Sig: Take 1 capsule (40 mg total) by mouth every morning. December 2018    Dispense:  30 capsule    Refill:  0  . lisdexamfetamine (VYVANSE) 40 MG capsule    Sig: Take 1 capsule (40 mg total) by mouth every morning. January 2019 rx    Dispense:  30 capsule    Refill:  0  . lisdexamfetamine (VYVANSE) 40 MG capsule    Sig: Take 1 capsule (40 mg total) by mouth every morning. February 2019    Dispense:  30 capsule    Refill:  0    CMA served as Neurosurgeon during this visit. History, Physical and Plan performed by medical provider. Documentation and orders reviewed and attested to.  Danise Edge, MD

## 2017-01-22 NOTE — Patient Instructions (Addendum)
Try Lidocaine patches to low back at night as needed  Carbohydrate Counting for Diabetes Mellitus, Adult Carbohydrate counting is a method for keeping track of how many carbohydrates you eat. Eating carbohydrates naturally increases the amount of sugar (glucose) in the blood. Counting how many carbohydrates you eat helps keep your blood glucose within normal limits, which helps you manage your diabetes (diabetes mellitus). It is important to know how many carbohydrates you can safely have in each meal. This is different for every person. A diet and nutrition specialist (registered dietitian) can help you make a meal plan and calculate how many carbohydrates you should have at each meal and snack. Carbohydrates are found in the following foods:  Grains, such as breads and cereals.  Dried beans and soy products.  Starchy vegetables, such as potatoes, peas, and corn.  Fruit and fruit juices.  Milk and yogurt.  Sweets and snack foods, such as cake, cookies, candy, chips, and soft drinks.  How do I count carbohydrates? There are two ways to count carbohydrates in food. You can use either of the methods or a combination of both. Reading "Nutrition Facts" on packaged food The "Nutrition Facts" list is included on the labels of almost all packaged foods and beverages in the [place] It includes:  The serving size.  Information about nutrients in each serving, including the grams (g) of carbohydrate per serving.  To use the "Nutrition Facts":  Decide how many servings you will have.  Multiply the number of servings by the number of carbohydrates per serving.  The resulting number is the total amount of carbohydrates that you will be having.  Learning standard serving sizes of other foods When you eat foods containing carbohydrates that are not packaged or do not include "Nutrition Facts" on the label, you need to measure the servings in order to count the amount of carbohydrates:  Measure  the foods that you will eat with a food scale or measuring cup, if needed.  Decide how many standard-size servings you will eat.  Multiply the number of servings by 15. Most carbohydrate-rich foods have about 15 g of carbohydrates per serving. ? For example, if you eat 8 oz (170 g) of strawberries, you will have eaten 2 servings and 30 g of carbohydrates (2 servings x 15 g = 30 g).  For foods that have more than one food mixed, such as soups and casseroles, you must count the carbohydrates in each food that is included.  The following list contains standard serving sizes of common carbohydrate-rich foods. Each of these servings has about 15 g of carbohydrates:   hamburger bun or  English muffin.   oz (15 mL) syrup.   oz (14 g) jelly.  1 slice of bread.  1 six-inch tortilla.  3 oz (85 g) cooked rice or pasta.  4 oz (113 g) cooked dried beans.  4 oz (113 g) starchy vegetable, such as peas, corn, or potatoes.  4 oz (113 g) hot cereal.  4 oz (113 g) mashed potatoes or  of a large baked potato.  4 oz (113 g) canned or frozen fruit.  4 oz (120 mL) fruit juice.  4-6 crackers.  6 chicken nuggets.  6 oz (170 g) unsweetened dry cereal.  6 oz (170 g) plain fat-free yogurt or yogurt sweetened with artificial sweeteners.  8 oz (240 mL) milk.  8 oz (170 g) fresh fruit or one small piece of fruit.  24 oz (680 g) popped popcorn.  Example of  carbohydrate counting Sample meal  3 oz (85 g) chicken breast.  6 oz (170 g) brown rice.  4 oz (113 g) corn.  8 oz (240 mL) milk.  8 oz (170 g) strawberries with sugar-free whipped topping. Carbohydrate calculation 1. Identify the foods that contain carbohydrates: ? Rice. ? Corn. ? Milk. ? Strawberries. 2. Calculate how many servings you have of each food: ? 2 servings rice. ? 1 serving corn. ? 1 serving milk. ? 1 serving strawberries. 3. Multiply each number of servings by 15 g: ? 2 servings rice x 15 g = 30 g. ? 1  serving corn x 15 g = 15 g. ? 1 serving milk x 15 g = 15 g. ? 1 serving strawberries x 15 g = 15 g. 4. Add together all of the amounts to find the total grams of carbohydrates eaten: ? 30 g + 15 g + 15 g + 15 g = 75 g of carbohydrates total. This information is not intended to replace advice given to you by your health care provider. Make sure you discuss any questions you have with your health care provider. Document Released: 02/02/2005 Document Revised: 08/23/2015 Document Reviewed: 07/17/2015 Elsevier Interactive Patient Education  [employer]2018 [employer] Inc[employer]

## 2017-01-22 NOTE — Assessment & Plan Note (Signed)
Encouraged moist heat and gentle stretching as tolerated. May try NSAIDs and prescription meds as directed and report if symptoms worsen or seek immediate care, try Lidcocaine patch

## 2017-01-22 NOTE — Assessment & Plan Note (Signed)
hgba1c acceptable, minimize simple carbs. Increase exercise as tolerated. Continue current meds 

## 2017-01-26 LAB — PAIN MGMT, PROFILE 8 W/CONF, U
6 ACETYLMORPHINE: NEGATIVE ng/mL (ref <10)
ALCOHOL METABOLITES: NEGATIVE ng/mL (ref <500)
AMPHETAMINES: POSITIVE ng/mL — AB (ref <500)
Amphetamine: 3468 ng/mL — ABNORMAL HIGH (ref <250)
BENZODIAZEPINES: NEGATIVE ng/mL (ref <100)
Buprenorphine, Urine: NEGATIVE ng/mL (ref <5)
COCAINE METABOLITE: NEGATIVE ng/mL (ref <150)
Creatinine: 217.7 mg/dL
MDMA: NEGATIVE ng/mL (ref <500)
Marijuana Metabolite: NEGATIVE ng/mL (ref <20)
Methamphetamine: NEGATIVE ng/mL (ref <250)
OPIATES: NEGATIVE ng/mL (ref <100)
Oxidant: NEGATIVE ug/mL (ref <200)
Oxycodone: NEGATIVE ng/mL (ref <100)
pH: 5.71 (ref 4.5–9.0)

## 2017-01-27 DIAGNOSIS — R7989 Other specified abnormal findings of blood chemistry: Secondary | ICD-10-CM | POA: Insufficient documentation

## 2017-01-27 DIAGNOSIS — R945 Abnormal results of liver function studies: Secondary | ICD-10-CM | POA: Insufficient documentation

## 2017-01-27 NOTE — Assessment & Plan Note (Signed)
Encouraged DASH diet, decrease po intake and increase exercise as tolerated. Needs 7-8 hours of sleep nightly.  

## 2017-01-27 NOTE — Assessment & Plan Note (Signed)
Avoid offending foods, start probiotics. Do not eat large meals in late evening and consider raising head of bed.  

## 2017-02-05 ENCOUNTER — Ambulatory Visit: Payer: Self-pay | Admitting: Family Medicine

## 2017-02-11 ENCOUNTER — Other Ambulatory Visit: Payer: Self-pay

## 2017-02-11 MED ORDER — PANTOPRAZOLE SODIUM 40 MG PO TBEC: DELAYED_RELEASE_TABLET | ORAL | 1 refills | Status: DC

## 2017-03-08 DIAGNOSIS — Z20828 Contact with and (suspected) exposure to other viral communicable diseases: Secondary | ICD-10-CM | POA: Diagnosis not present

## 2017-03-08 DIAGNOSIS — R197 Diarrhea, unspecified: Secondary | ICD-10-CM | POA: Diagnosis not present

## 2017-03-08 DIAGNOSIS — R112 Nausea with vomiting, unspecified: Secondary | ICD-10-CM | POA: Diagnosis not present

## 2017-03-25 ENCOUNTER — Ambulatory Visit: Payer: Self-pay | Admitting: Family Medicine

## 2017-04-09 ENCOUNTER — Other Ambulatory Visit: Payer: Self-pay | Admitting: Family Medicine

## 2017-04-13 NOTE — Telephone Encounter (Signed)
Last Vyvanse RX: 3 RXs given on 01/22/17 for Dec, Jan and Feb. #30 each Last OV: 01/22/17 Next OV: 08/06/17 UDS: 01/22/17 CSC: 03/12/16 CSR:  Please see controlled substance Database. I do not see that pharmacy has filled prescriptions for January or February yet.   Spoke with pharmacist, they have the Rxs for January and February on hold and will fill the January Rx now. Message sent to pt.

## 2017-04-14 ENCOUNTER — Ambulatory Visit: Payer: 59 | Admitting: Family

## 2017-04-14 ENCOUNTER — Encounter: Payer: Self-pay | Admitting: Family

## 2017-04-14 DIAGNOSIS — I10 Essential (primary) hypertension: Secondary | ICD-10-CM

## 2017-04-14 DIAGNOSIS — M259 Joint disorder, unspecified: Secondary | ICD-10-CM

## 2017-04-14 MED ORDER — AMLODIPINE BESYLATE 5 MG PO TABS: 5.0000 mg | ORAL_TABLET | Freq: Every day | ORAL | 1 refills | Status: DC

## 2017-04-14 NOTE — Progress Notes (Signed)
Subjective:    Patient ID: Scott Pope, male    DOB: March 22, 1990, 27 y.o.   MRN: [id number]  HPI  Pt is a 27 yr old male who presents today to discuss hypertension.    HTN- maintained on toprol xl 100mg .  Reports that he went on vacation and had blurred vision and chest pressure.  Reports that he "ignored it."  Symptoms resolved on their own. That night he had blurred vision again.  Reports that decided to test his BP was 179/110 when he checked it. Was 185/109 last night.    Obesity-patient reports that he has difficulty losing weight. BP Readings from Last 3 Encounters:  04/14/17 (!) 147/67  01/22/17 124/89  03/19/16 (!) 149/86     Review of Systems See HPI  Past Medical History:  Diagnosis Date  . Abdominal pain, epigastric 04/04/2015  . Acute low back pain 07/07/2010  . ADHD (attention deficit hyperactivity disorder) 07/07/2010  . Allergic state 09/04/2010  . Benign essential HTN 01/14/2009   Qualifier: Diagnosis of  By: Caryl Never MD, Bruce    . 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 Cough 04/06/2011  . Depression   . ELEVATED BLOOD PRESSURE 01/14/2009  . GERD 01/14/2009  . Hyperglycemia 10/11/2015  . Hyperlipidemia, mixed 10/11/2015  . IBS (irritable bowel syndrome) 08/25/2013  . Insomnia 08/04/2010  . Knee pain, left 09/24/2011  . Otitis media 11/24/2011  . Otitis media of left ear 12/09/2010  . Overweight(278.02) 07/07/2010  . Preventative health care 11/22/2012  . RUQ pain 04/04/2015  . Skin lesion of left leg 04/01/2011  . SOB (shortness of breath) on exertion 03/06/2011  . 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 Tick bite 10/17/2012  . Tinea corporis 11/22/2012  . Viral gastroenteritis 10/17/2010     Social History   Socioeconomic History  . Marital status: Single    Spouse name: Not on file  . Number of children: Not on file  . Years of education: Not on file  . Highest education level: Not on file  Social Needs  . Financial resource strain: Not on file  . Food  insecurity - worry: Not on file  . Food insecurity - inability: Not on file  . Transportation needs - medical: Not on file  . Transportation needs - non-medical: Not on file  Occupational History  . Not on file  Tobacco Use  . Smoking status: Never Smoker  . Smokeless tobacco: Never Used  Substance and Sexual Activity  . Alcohol use: No    Alcohol/week: 0.0 oz  . Drug use: No  . Sexual activity: No  Other Topics Concern  . Not on file  Social History Narrative  . Not on file    Past Surgical History:  Procedure Laterality Date  . CIRCUMCISION  AT 27YRS OLD   HAD WILD REACTION TO SEDATION MED GIVEN  . THYROGLOSSAL DUCT CYST     excised at 27 years of age  . TONSILLECTOMY AND ADENOIDECTOMY    . TYMPANOSTOMY TUBE PLACEMENT     multiple     Family History  Problem Relation Age of Onset  . Obesity Mother   . Depression Mother   . Fibromyalgia Mother   . Hypertension Mother   . Chronic fatigue Mother   . Hypertension Father   . Fibromyalgia Maternal Grandmother   . Other Maternal Grandmother        reflux  . Other Maternal Grandfather        Lems disease  .  Hypertension Paternal Grandfather   . Arthritis Paternal Grandfather   . Gallbladder disease Unknown        unknown  . Colon cancer Neg Hx   . Esophageal cancer Neg Hx   . Stomach cancer Neg Hx   . Pancreatic cancer Neg Hx   . Colon polyps Neg Hx     Allergies  Allergen Reactions  . Strattera [Atomoxetine Hcl]     Suicidal thoughts, depression    Current Outpatient Medications on File Prior to Visit  Medication Sig Dispense Refill  . lisdexamfetamine (VYVANSE) 40 MG capsule Take 1 capsule (40 mg total) by mouth every morning. December 2018 30 capsule 0  . lisdexamfetamine (VYVANSE) 40 MG capsule Take 1 capsule (40 mg total) by mouth every morning. January 2019 rx 30 capsule 0  . lisdexamfetamine (VYVANSE) 40 MG capsule Take 1 capsule (40 mg total) by mouth every morning. February 2019 30 capsule 0  .  metoprolol succinate (TOPROL-XL) 100 MG 24 hr tablet Take 1 tablet (100 mg total) by mouth daily. Take with or immediately following a meal. 90 tablet 1  . pantoprazole (PROTONIX) 40 MG tablet TAKE 1 TABLET (40 MG TOTAL) BY MOUTH DAILY. 90 tablet 1   No current facility-administered medications on file prior to visit.     BP (!) 147/67 (BP Location: Right Arm, Patient Position: Sitting, Cuff Size: Large)   Pulse 80   Temp 98.4 F (36.9 C) (Oral)   Resp 16   Ht 6' 3.2" (1.91 m)   Wt (!) 330 lb (149.7 kg)   SpO2 100%   BMI 41.03 kg/m        Objective:   Physical Exam  Constitutional: He is oriented to person, place, and time. He appears well-developed and well-nourished. No distress.  HENT:  Head: Normocephalic and atraumatic.  Cardiovascular: Normal rate and regular rhythm.  No murmur heard. Pulmonary/Chest: Effort normal and breath sounds normal. No respiratory distress. He has no wheezes. He has no rales.  Musculoskeletal: He exhibits no edema.  Neurological: He is alert and oriented to person, place, and time.  Skin: Skin is warm and dry.  Psychiatric: He has a normal mood and affect. His behavior is normal. Thought content normal.          Assessment & Plan:  Hypertension-uncontrolled.  He reports good med compliance with beta-blocker.  We will add amlodipine 5 mg once daily.  He is advised to follow-up with his primary care provider in 1 month.  Morbid obesity-looking back at his medical record over the last 6 years he has gained approximately 70 pounds.  He is interested in a referral to nutrition and I will place referral. Wt Readings from Last 3 Encounters:  04/14/17 (!) 330 lb (149.7 kg)  01/22/17 (!) 330 lb 12.8 oz (150 kg)  02/04/16 (!) 330 lb (149.7 kg)   History of left knee injury-patient reports that he had remote injury of dislocated left knee.  Reports some instability of the left knee.  He is interested in starting a running regimen but inquires about  possible need for a brace.  I have advised him to see sports medicine for further evaluation.  Referral has been placed.

## 2017-04-14 NOTE — Patient Instructions (Signed)
Add amlodipine once daily. Continue metoprolol. You will be contacted about referral to nutrition and sports medicine.

## 2017-04-15 ENCOUNTER — Other Ambulatory Visit: Payer: Self-pay | Admitting: Family Medicine

## 2017-04-15 NOTE — Telephone Encounter (Signed)
Copied from CRM [id number]. Topic: Quick Communication - Rx Refill/Question >> Apr 15, 2017  3:01 PM Rudi CocoLathan, Mackenzie Groom M, [place] wrote: Medication: lisdexamfetamine (VYVANSE) 40 MG capsule [[id number]][[id number]]    Has the patient contacted their pharmacy? yes   (Agent: If no, request that the patient contact the pharmacy for the refill.)   Preferred Pharmacy (with phone number or street name): [id number] - [place], [place] - [address] [place]  [id number] Phone: [phone] Fax: [phone]     Agent: Please be advised that RX refills may take up to 3 business days. We ask that you follow-up with your pharmacy.

## 2017-04-16 NOTE — Telephone Encounter (Signed)
Pt mother calling back stating that her son has been out of medicine since Wednesday please give her a call back [facility] [phone]

## 2017-04-16 NOTE — Telephone Encounter (Signed)
Refill of Vyvanse  LOV 04/14/17    M.Oliver Barre'Sullivan  RX verified

## 2017-04-21 ENCOUNTER — Other Ambulatory Visit: Payer: Self-pay

## 2017-04-21 MED ORDER — LISDEXAMFETAMINE DIMESYLATE 40 MG PO CAPS: 40.0000 mg | ORAL_CAPSULE | ORAL | 0 refills | Status: DC

## 2017-04-21 NOTE — Telephone Encounter (Signed)
Requesting: VYVANSE 40 MG Contract: 03/12/16 UDS: 01/22/17 Low Risk Last OV: 04/14/17 Next OV: 05/17/17 Last Refill: 01/22/17   #30   Please advise

## 2017-04-21 NOTE — Telephone Encounter (Signed)
This had not been set to normal so it printed. Once we shred the paper copy we can reload it to eprescribe. thanks

## 2017-04-21 NOTE — Telephone Encounter (Signed)
Request sent to PCP

## 2017-04-23 MED ORDER — LISDEXAMFETAMINE DIMESYLATE 40 MG PO CAPS: 40.0000 mg | ORAL_CAPSULE | ORAL | 0 refills | Status: DC

## 2017-04-23 NOTE — Addendum Note (Signed)
Addended by: Crissie SicklesARTER, Diron Haddon A on: 04/23/2017 10:20 AM   Modules accepted: Orders

## 2017-05-17 ENCOUNTER — Ambulatory Visit: Payer: 59 | Admitting: Family Medicine

## 2017-05-17 DIAGNOSIS — Z0289 Encounter for other administrative examinations: Secondary | ICD-10-CM

## 2017-05-24 ENCOUNTER — Encounter: Payer: Self-pay | Admitting: Family Medicine

## 2017-05-27 ENCOUNTER — Encounter (INDEPENDENT_AMBULATORY_CARE_PROVIDER_SITE_OTHER): Payer: Self-pay

## 2017-05-28 NOTE — Telephone Encounter (Signed)
Copied from CRM [id number]. Topic: Referral - Question >> May 27, 2017 10:47 AM Oneal GroutSebastian, Jennifer S wrote: Reason for CRM: Would like to speak with someone regarding referral, unaware of referral, states he is not having problems with his knee. Would like a call back

## 2017-06-24 DIAGNOSIS — J029 Acute pharyngitis, unspecified: Secondary | ICD-10-CM | POA: Diagnosis not present

## 2017-06-24 DIAGNOSIS — I10 Essential (primary) hypertension: Secondary | ICD-10-CM | POA: Diagnosis not present

## 2017-06-24 DIAGNOSIS — J069 Acute upper respiratory infection, unspecified: Secondary | ICD-10-CM | POA: Diagnosis not present

## 2017-07-02 ENCOUNTER — Other Ambulatory Visit: Payer: Self-pay | Admitting: Family

## 2017-08-06 ENCOUNTER — Ambulatory Visit: Payer: 59 | Admitting: Family Medicine

## 2017-08-06 VITALS — BP 120/82 | HR 69 | Temp 97.6°F | Resp 18 | Ht 75.0 in | Wt 326.8 lb

## 2017-08-06 DIAGNOSIS — R945 Abnormal results of liver function studies: Secondary | ICD-10-CM | POA: Diagnosis not present

## 2017-08-06 DIAGNOSIS — R739 Hyperglycemia, unspecified: Secondary | ICD-10-CM | POA: Diagnosis not present

## 2017-08-06 DIAGNOSIS — F909 Attention-deficit hyperactivity disorder, unspecified type: Secondary | ICD-10-CM | POA: Diagnosis not present

## 2017-08-06 DIAGNOSIS — K219 Gastro-esophageal reflux disease without esophagitis: Secondary | ICD-10-CM

## 2017-08-06 DIAGNOSIS — I10 Essential (primary) hypertension: Secondary | ICD-10-CM

## 2017-08-06 DIAGNOSIS — J029 Acute pharyngitis, unspecified: Secondary | ICD-10-CM | POA: Diagnosis not present

## 2017-08-06 DIAGNOSIS — R7989 Other specified abnormal findings of blood chemistry: Secondary | ICD-10-CM

## 2017-08-06 LAB — COMPREHENSIVE METABOLIC PANEL
ALBUMIN: 4.3 g/dL (ref 3.5–5.2)
ALK PHOS: 78 U/L (ref 39–117)
ALT: 103 U/L — AB (ref 0–53)
AST: 61 U/L — AB (ref 0–37)
BILIRUBIN TOTAL: 0.8 mg/dL (ref 0.2–1.2)
BUN: 11 mg/dL (ref 6–23)
CALCIUM: 9.4 mg/dL (ref 8.4–10.5)
CO2: 28 mEq/L (ref 19–32)
CREATININE: 0.94 mg/dL (ref 0.40–1.50)
Chloride: 102 mEq/L (ref 96–112)
GFR: 104.04 mL/min (ref >60.00)
Glucose, Bld: 108 mg/dL — ABNORMAL HIGH (ref 70–99)
Potassium: 4.2 mEq/L (ref 3.5–5.1)
Sodium: 138 mEq/L (ref 135–145)
TOTAL PROTEIN: 6.9 g/dL (ref 6.0–8.3)

## 2017-08-06 LAB — CBC WITH DIFFERENTIAL/PLATELET
BASOS ABS: 0.1 10*3/uL (ref 0.0–0.1)
Basophils Relative: 1.1 % (ref 0.0–3.0)
EOS ABS: 0.3 10*3/uL (ref 0.0–0.7)
Eosinophils Relative: 3.3 % (ref 0.0–5.0)
HEMATOCRIT: 43.8 % (ref 39.0–52.0)
HEMOGLOBIN: 15.1 g/dL (ref 13.0–17.0)
LYMPHS PCT: 26.6 % (ref 12.0–46.0)
Lymphs Abs: 2.3 10*3/uL (ref 0.7–4.0)
MCHC: 34.5 g/dL (ref 30.0–36.0)
MCV: 87.4 fl (ref 78.0–100.0)
MONOS PCT: 9.3 % (ref 3.0–12.0)
Monocytes Absolute: 0.8 10*3/uL (ref 0.1–1.0)
NEUTROS ABS: 5.1 10*3/uL (ref 1.4–7.7)
Neutrophils Relative %: 59.7 % (ref 43.0–77.0)
PLATELETS: 284 10*3/uL (ref 150.0–400.0)
RBC: 5.01 Mil/uL (ref 4.22–5.81)
RDW: 13.1 % (ref 11.5–15.5)
WBC: 8.5 10*3/uL (ref 4.0–10.5)

## 2017-08-06 LAB — MONONUCLEOSIS SCREEN: Mono Screen: NEGATIVE

## 2017-08-06 MED ORDER — LISDEXAMFETAMINE DIMESYLATE 40 MG PO CAPS: 40.0000 mg | ORAL_CAPSULE | ORAL | 0 refills | Status: DC

## 2017-08-06 MED ORDER — AMOXICILLIN-POT CLAVULANATE 875-125 MG PO TABS: 1.0000 | ORAL_TABLET | Freq: Two times a day (BID) | ORAL | 0 refills | Status: DC

## 2017-08-06 MED ORDER — RANITIDINE HCL 300 MG PO TABS
300.0000 mg | ORAL_TABLET | Freq: Every day | ORAL | 2 refills | Status: DC
Start: 2017-08-06 — End: 2017-11-21

## 2017-08-06 NOTE — Patient Instructions (Signed)

## 2017-08-08 DIAGNOSIS — J029 Acute pharyngitis, unspecified: Secondary | ICD-10-CM | POA: Insufficient documentation

## 2017-08-08 NOTE — Assessment & Plan Note (Signed)
hgba1c acceptable, minimize simple carbs. Increase exercise as tolerated.  

## 2017-08-08 NOTE — Assessment & Plan Note (Signed)
Well controlled, no changes to meds. Encouraged heart healthy diet such as the DASH diet and exercise as tolerated.  °

## 2017-08-08 NOTE — Assessment & Plan Note (Signed)
Given refills on Vyvanse. UDS and contract UTD

## 2017-08-08 NOTE — Assessment & Plan Note (Signed)
Likely fatty liver needs to minimize carbs and increase exercise

## 2017-08-08 NOTE — Assessment & Plan Note (Signed)
Avoid offending foods, start probiotics. Do not eat large meals in late evening and consider raising head of bed.  

## 2017-08-08 NOTE — Progress Notes (Signed)
Subjective:    Patient ID: Scott Pope, male    DOB: 1990-02-28, 27 y.o.   MRN: [id number]  Chief Complaint  Patient presents with  . Medication Refill    HPI Patient is in today for follow up. No recent febrile illness or hospitalizations. He notes a sore throat for 6-7 weeks. Varies in intensity but has not resolved. Is worse on the right side. Sore in throat and roof of mouth at times. Denies any dysphagia or sour fluid in throat at night. Denies CP/palp/SOB/HA/congestion/fevers/GI or GU c/o. Taking meds as prescribed  Past Medical History:  Diagnosis Date  . Abdominal pain, epigastric 04/04/2015  . Acute low back pain 07/07/2010  . ADHD (attention deficit hyperactivity disorder) 07/07/2010  . Allergic state 09/04/2010  . Benign essential HTN 01/14/2009   Qualifier: Diagnosis of  By: Caryl Never MD, Bruce    . 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 Cough 04/06/2011  . Depression   . ELEVATED BLOOD PRESSURE 01/14/2009  . GERD 01/14/2009  . Hyperglycemia 10/11/2015  . Hyperlipidemia, mixed 10/11/2015  . IBS (irritable bowel syndrome) 08/25/2013  . Insomnia 08/04/2010  . Knee pain, left 09/24/2011  . Otitis media 11/24/2011  . Otitis media of left ear 12/09/2010  . Overweight(278.02) 07/07/2010  . Preventative health care 11/22/2012  . RUQ pain 04/04/2015  . Skin lesion of left leg 04/01/2011  . SOB (shortness of breath) on exertion 03/06/2011  . 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 Tick bite 10/17/2012  . Tinea corporis 11/22/2012  . Viral gastroenteritis 10/17/2010    Past Surgical History:  Procedure Laterality Date  . CIRCUMCISION  AT 27YRS OLD   HAD WILD REACTION TO SEDATION MED GIVEN  . THYROGLOSSAL DUCT CYST     excised at 27 years of age  . TONSILLECTOMY AND ADENOIDECTOMY    . TYMPANOSTOMY TUBE PLACEMENT     multiple     Family History  Problem Relation Age of Onset  . Obesity Mother   . Depression Mother   . Fibromyalgia Mother   . Hypertension Mother   . Chronic  fatigue Mother   . Hypertension Father   . Fibromyalgia Maternal Grandmother   . Other Maternal Grandmother        reflux  . Other Maternal Grandfather        Lems disease  . Hypertension Paternal Grandfather   . Arthritis Paternal Grandfather   . Gallbladder disease Unknown        unknown  . Colon cancer Neg Hx   . Esophageal cancer Neg Hx   . Stomach cancer Neg Hx   . Pancreatic cancer Neg Hx   . Colon polyps Neg Hx     Social History   Socioeconomic History  . Marital status: Single    Spouse name: Not on file  . Number of children: Not on file  . Years of education: Not on file  . Highest education level: Not on file  Occupational History  . Not on file  Social Needs  . Financial resource strain: Not on file  . Food insecurity:    Worry: Not on file    Inability: Not on file  . Transportation needs:    Medical: Not on file    Non-medical: Not on file  Tobacco Use  . Smoking status: Never Smoker  . Smokeless tobacco: Never Used  Substance and Sexual Activity  . Alcohol use: No    Alcohol/week: 0.0 oz  . Drug use: No  .  Sexual activity: Never  Lifestyle  . Physical activity:    Days per week: Not on file    Minutes per session: Not on file  . Stress: Not on file  Relationships  . Social connections:    Talks on phone: Not on file    Gets together: Not on file    Attends religious service: Not on file    Active member of club or organization: Not on file    Attends meetings of clubs or organizations: Not on file    Relationship status: Not on file  . Intimate partner violence:    Fear of current or ex partner: Not on file    Emotionally abused: Not on file    Physically abused: Not on file    Forced sexual activity: Not on file  Other Topics Concern  . Not on file  Social History Narrative  . Not on file    Outpatient Medications Prior to Visit  Medication Sig Dispense Refill  . amLODipine (NORVASC) 5 MG tablet TAKE 1 TABLET BY MOUTH EVERY DAY 30  tablet 1  . metoprolol succinate (TOPROL-XL) 100 MG 24 hr tablet Take 1 tablet (100 mg total) by mouth daily. Take with or immediately following a meal. 90 tablet 1  . pantoprazole (PROTONIX) 40 MG tablet TAKE 1 TABLET (40 MG TOTAL) BY MOUTH DAILY. 90 tablet 1  . lisdexamfetamine (VYVANSE) 40 MG capsule Take 1 capsule (40 mg total) by mouth every morning. May 2019 30 capsule 0  . lisdexamfetamine (VYVANSE) 40 MG capsule Take 1 capsule (40 mg total) by mouth every morning. April 2019 30 capsule 0  . lisdexamfetamine (VYVANSE) 40 MG capsule Take 1 capsule (40 mg total) by mouth every morning. March 2019 30 capsule 0   No facility-administered medications prior to visit.     Allergies  Allergen Reactions  . Strattera [Atomoxetine Hcl]     Suicidal thoughts, depression    Review of Systems  Constitutional: Negative for fever and malaise/fatigue.  HENT: Positive for sore throat. Negative for congestion.   Eyes: Negative for blurred vision.  Respiratory: Negative for shortness of breath.   Cardiovascular: Negative for chest pain, palpitations and leg swelling.  Gastrointestinal: Negative for abdominal pain, blood in stool and nausea.  Genitourinary: Negative for dysuria and frequency.  Musculoskeletal: Negative for falls.  Skin: Negative for rash.  Neurological: Negative for dizziness, loss of consciousness and headaches.  Endo/Heme/Allergies: Negative for environmental allergies.  Psychiatric/Behavioral: Negative for depression. The patient is not nervous/anxious.        Objective:    Physical Exam  Constitutional: He is oriented to person, place, and time. He appears well-developed and well-nourished. No distress.  HENT:  Head: Normocephalic and atraumatic.  Nose: Nose normal.  Eyes: Right eye exhibits no discharge. Left eye exhibits no discharge.  Neck: Normal range of motion. Neck supple.  Cardiovascular: Normal rate and regular rhythm.  No murmur heard. Pulmonary/Chest:  Effort normal and breath sounds normal.  Abdominal: Soft. Bowel sounds are normal. There is no tenderness.  Musculoskeletal: He exhibits no edema.  Neurological: He is alert and oriented to person, place, and time.  Skin: Skin is warm and dry.  Psychiatric: He has a normal mood and affect.  Nursing note and vitals reviewed.   BP 120/82 (BP Location: Left Arm, Patient Position: Sitting, Cuff Size: Normal)   Pulse 69   Temp 97.6 F (36.4 C) (Oral)   Resp 18   Ht 6\' 3"  (1.905 m)  Wt (!) 326 lb 12.8 oz (148.2 kg)   SpO2 98%   BMI 40.85 kg/m  Wt Readings from Last 3 Encounters:  08/06/17 (!) 326 lb 12.8 oz (148.2 kg)  04/14/17 (!) 330 lb (149.7 kg)  01/22/17 (!) 330 lb 12.8 oz (150 kg)     Lab Results  Component Value Date   WBC 8.5 08/06/2017   HGB 15.1 08/06/2017   HCT 43.8 08/06/2017   PLT 284.0 08/06/2017   GLUCOSE 108 (H) 08/06/2017   CHOL 148 01/22/2017   TRIG 155.0 (H) 01/22/2017   HDL 36.60 (L) 01/22/2017   LDLCALC 80 01/22/2017   ALT 103 (H) 08/06/2017   AST 61 (H) 08/06/2017   NA 138 08/06/2017   K 4.2 08/06/2017   CL 102 08/06/2017   CREATININE 0.94 08/06/2017   BUN 11 08/06/2017   CO2 28 08/06/2017   TSH 1.95 01/22/2017   HGBA1C 5.2 01/22/2017    Lab Results  Component Value Date   TSH 1.95 01/22/2017   Lab Results  Component Value Date   WBC 8.5 08/06/2017   HGB 15.1 08/06/2017   HCT 43.8 08/06/2017   MCV 87.4 08/06/2017   PLT 284.0 08/06/2017   Lab Results  Component Value Date   NA 138 08/06/2017   K 4.2 08/06/2017   CO2 28 08/06/2017   GLUCOSE 108 (H) 08/06/2017   BUN 11 08/06/2017   CREATININE 0.94 08/06/2017   BILITOT 0.8 08/06/2017   ALKPHOS 78 08/06/2017   AST 61 (H) 08/06/2017   ALT 103 (H) 08/06/2017   PROT 6.9 08/06/2017   ALBUMIN 4.3 08/06/2017   CALCIUM 9.4 08/06/2017   ANIONGAP 8 01/29/2016   GFR 104.04 08/06/2017   Lab Results  Component Value Date   CHOL 148 01/22/2017   Lab Results  Component Value Date    HDL 36.60 (L) 01/22/2017   Lab Results  Component Value Date   LDLCALC 80 01/22/2017   Lab Results  Component Value Date   TRIG 155.0 (H) 01/22/2017   Lab Results  Component Value Date   CHOLHDL 4 01/22/2017   Lab Results  Component Value Date   HGBA1C 5.2 01/22/2017       Assessment & Plan:   Problem List Items Addressed This Visit    GERD    Avoid offending foods, start probiotics. Do not eat large meals in late evening and consider raising head of bed.       Relevant Medications   ranitidine (ZANTAC) 300 MG tablet   Benign essential HTN    Well controlled, no changes to meds. Encouraged heart healthy diet such as the DASH diet and exercise as tolerated.       ADHD (attention deficit hyperactivity disorder)    Given refills on Vyvanse. UDS and contract UTD      Hyperglycemia    hgba1c acceptable, minimize simple carbs. Increase exercise as tolerated.       Abnormal liver function test    Likely fatty liver needs to minimize carbs and increase exercise      Relevant Orders   Comprehensive metabolic panel (Completed)   Pharyngitis - Primary    He reports varying degree of sore throat for 6-7 weeks. Is worse on the right side. Will treat reflux more aggressively and start antibiotics. If no response then will need to let us know for referral to ENT and further evaluation.       Relevant Orders   Monospot (Completed)   CBC with Differential/Platelet (  Completed)      I have changed Isair T. Harvell's lisdexamfetamine, lisdexamfetamine, and lisdexamfetamine. I am also having him start on amoxicillin-clavulanate and ranitidine. Additionally, I am having him maintain his metoprolol succinate, pantoprazole, and amLODipine.  Meds ordered this encounter  Medications  . lisdexamfetamine (VYVANSE) 40 MG capsule    Sig: Take 1 capsule (40 mg total) by mouth every morning. August 2019    Dispense:  30 capsule    Refill:  0  . lisdexamfetamine (VYVANSE) 40 MG capsule     Sig: Take 1 capsule (40 mg total) by mouth every morning. July 2019    Dispense:  30 capsule    Refill:  0  . lisdexamfetamine (VYVANSE) 40 MG capsule    Sig: Take 1 capsule (40 mg total) by mouth every morning. June 2019    Dispense:  30 capsule    Refill:  0  . amoxicillin-clavulanate (AUGMENTIN) 875-125 MG tablet    Sig: Take 1 tablet by mouth 2 (two) times daily.    Dispense:  20 tablet    Refill:  0  . ranitidine (ZANTAC) 300 MG tablet    Sig: Take 1 tablet (300 mg total) by mouth at bedtime.    Dispense:  30 tablet    Refill:  2     Danise Edge, MD

## 2017-08-08 NOTE — Assessment & Plan Note (Signed)
He reports varying degree of sore throat for 6-7 weeks. Is worse on the right side. Will treat reflux more aggressively and start antibiotics. If no response then will need to let us know for referral to ENT and further evaluation.

## 2017-08-15 ENCOUNTER — Other Ambulatory Visit: Payer: Self-pay | Admitting: Family Medicine

## 2017-08-18 DIAGNOSIS — R109 Unspecified abdominal pain: Secondary | ICD-10-CM | POA: Diagnosis not present

## 2017-08-18 DIAGNOSIS — K625 Hemorrhage of anus and rectum: Secondary | ICD-10-CM | POA: Diagnosis not present

## 2017-09-08 ENCOUNTER — Other Ambulatory Visit: Payer: Self-pay | Admitting: Family

## 2017-09-25 DIAGNOSIS — Z23 Encounter for immunization: Secondary | ICD-10-CM | POA: Diagnosis not present

## 2017-10-27 ENCOUNTER — Encounter: Payer: Self-pay | Admitting: Family Medicine

## 2017-10-28 ENCOUNTER — Encounter: Payer: Self-pay | Admitting: *Deleted

## 2017-10-28 MED ORDER — LISDEXAMFETAMINE DIMESYLATE 40 MG PO CAPS
40.0000 mg | ORAL_CAPSULE | ORAL | 0 refills | Status: DC
Start: 2017-10-28 — End: 2018-03-10

## 2017-10-28 MED ORDER — LISDEXAMFETAMINE DIMESYLATE 40 MG PO CAPS: 40.0000 mg | ORAL_CAPSULE | ORAL | 0 refills | Status: DC

## 2017-10-28 NOTE — Telephone Encounter (Signed)
Requesting:vyvnase Contract: yes UDS:low risk next screen 01/22/18 Last OV:08/06/17 Next OV:not scheduled  Last Refill:08/06/17 #[id number] 3 month supply  Database:   Please advise

## 2017-11-02 ENCOUNTER — Ambulatory Visit: Payer: Self-pay | Admitting: Internal Medicine

## 2017-11-03 DIAGNOSIS — M542 Cervicalgia: Secondary | ICD-10-CM | POA: Diagnosis not present

## 2017-11-03 DIAGNOSIS — M25512 Pain in left shoulder: Secondary | ICD-10-CM | POA: Diagnosis not present

## 2017-11-05 ENCOUNTER — Other Ambulatory Visit: Payer: Self-pay | Admitting: Family Medicine

## 2017-11-21 ENCOUNTER — Other Ambulatory Visit: Payer: Self-pay | Admitting: Family Medicine

## 2017-12-08 ENCOUNTER — Other Ambulatory Visit: Payer: Self-pay | Admitting: Family Medicine

## 2017-12-14 IMAGING — NM NM HEPATO W/GB/PHARM/[PERSON_NAME]
3 series · 13 of 13 positions shown · non-contrast
Comparison: None.

CLINICAL DATA: Chronic upper abdominal pain

EXAM:
NUCLEAR MEDICINE HEPATOBILIARY IMAGING WITH GALLBLADDER EF
Views: Anterior right upper quadrant
RADIOPHARMACEUTICALS:  5.0 mCi Gc-DDm  Choletec IV

[Series 1: biliary · 4.14mm/px · 6 of 59 frames shown]
[frame 5/59]
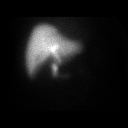
[frame 15/59]
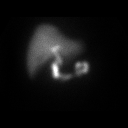
[frame 25/59]
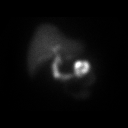
[frame 35/59]
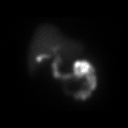
[frame 45/59]
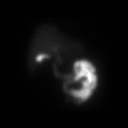
[frame 55/59]
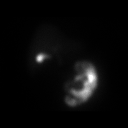

[Series 2: rt lat · 4.14mm/px · 1 of 1 slices shown]
[im 1/1]
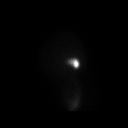

[Series 3: gbef · 4.14mm/px · 6 of 60 frames shown]
[frame 6/60]
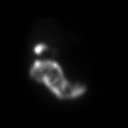
[frame 16/60]
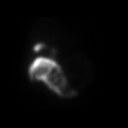
[frame 26/60]
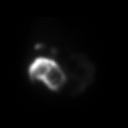
[frame 36/60]
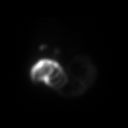
[frame 46/60]
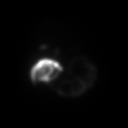
[frame 56/60]
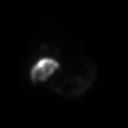

[13 of 13 positions shown; findings below may reference images not displayed]

FINDINGS: Liver uptake of radiotracer is normal. There is prompt visualization
of gallbladder and small bowel, indicating patency of the cystic and
common bile ducts. The patient consumed 8 ounces of Ensure orally
with calculation of the computer generated ejection fraction of
radiotracer from the gallbladder. The patient did not have clinical
symptoms with the oral Ensure consumption. The computer generated
ejection fraction of radiotracer from the gallbladder is normal at
87%, normal greater than 33% using the oral agent.
IMPRESSION: Study within normal limits.

## 2018-01-05 DIAGNOSIS — M7651 Patellar tendinitis, right knee: Secondary | ICD-10-CM | POA: Diagnosis not present

## 2018-03-10 ENCOUNTER — Other Ambulatory Visit: Payer: Self-pay | Admitting: Family Medicine

## 2018-03-10 ENCOUNTER — Telehealth: Payer: Self-pay | Admitting: Family Medicine

## 2018-03-10 MED ORDER — METOPROLOL SUCCINATE ER 25 MG PO TB24: 25.0000 mg | ORAL_TABLET | Freq: Every day | ORAL | 2 refills | Status: DC

## 2018-03-10 NOTE — Telephone Encounter (Signed)
Here today accompanying his mother for her appointment and they report his blood pressure has been running high off of his meds. He gets flushed and checks his pressure. Up as high as 178/74. Will restart Metoprolol XL 25 mg daily and see him in follow up in 4 weeks.

## 2018-05-08 DIAGNOSIS — I10 Essential (primary) hypertension: Secondary | ICD-10-CM | POA: Diagnosis not present

## 2018-05-08 DIAGNOSIS — R05 Cough: Secondary | ICD-10-CM | POA: Diagnosis not present

## 2018-05-08 DIAGNOSIS — Z20828 Contact with and (suspected) exposure to other viral communicable diseases: Secondary | ICD-10-CM | POA: Diagnosis not present

## 2018-05-10 ENCOUNTER — Ambulatory Visit: Payer: Self-pay | Admitting: Family Medicine

## 2018-05-10 NOTE — Telephone Encounter (Signed)
Pt. Started coughing Saturday. Was seen at [facility] and was negative for flu. Has non-productive cough, congestion, chest tightness. Has not taken any OTC due to HTN. Will try Coricidin and ask pharmacist what else is safe to take. Reviewed home remedies.States he was in contact with positive COVID 19. Will self quarantine x 14 days. Instructed if he has difficulty breathing to go to ED. Verbalizes understanding.  Answer Assessment - Initial Assessment Questions 1. ONSET: "When did the cough begin?"     Saturday 2. SEVERITY: "How bad is the cough today?"       Moderate 3. RESPIRATORY DISTRESS: "Describe your breathing."       No distress 4. FEVER: "Do you have a fever?" If so, ask: "What is your temperature, how was it measured, and when did it start?"     Low grade 5. HEMOPTYSIS: "Are you coughing up any blood?" If so ask: "How much?" (flecks, streaks, tablespoons, etc.)     No 6. TREATMENT: "What have you done so far to treat the cough?" (e.g., meds, fluids, humidifier)     Nothing 7. CARDIAC HISTORY: "Do you have any history of heart disease?" (e.g., heart attack, congestive heart failure)      No 8. LUNG HISTORY: "Do you have any history of lung disease?"  (e.g., pulmonary embolus, asthma, emphysema)     No 9. PE RISK FACTORS: "Do you have a history of blood clots?" (or: recent major surgery, recent prolonged travel, bedridden)     No 10. OTHER SYMPTOMS: "Do you have any other symptoms? (e.g., runny nose, wheezing, chest pain)       Chest tight, wheezing 11. PREGNANCY: "Is there any chance you are pregnant?" "When was your last menstrual period?"       n/a 12. TRAVEL: "Have you traveled out of the country in the last month?" (e.g., travel history, exposures)       No  Protocols used: COUGH - ACUTE NON-PRODUCTIVE-A-AH

## 2018-06-01 ENCOUNTER — Other Ambulatory Visit: Payer: Self-pay | Admitting: Family Medicine

## 2018-08-31 ENCOUNTER — Other Ambulatory Visit: Payer: Self-pay

## 2018-08-31 ENCOUNTER — Emergency Department (HOSPITAL_BASED_OUTPATIENT_CLINIC_OR_DEPARTMENT_OTHER)
Admission: EM | Admit: 2018-08-31 | Discharge: 2018-08-31 | Disposition: A | Discharge status: Home / Self Care | Payer: 59 | Attending: Emergency Medicine | Admitting: Emergency Medicine

## 2018-08-31 ENCOUNTER — Encounter (HOSPITAL_BASED_OUTPATIENT_CLINIC_OR_DEPARTMENT_OTHER): Payer: Self-pay

## 2018-08-31 DIAGNOSIS — H66002 Acute suppurative otitis media without spontaneous rupture of ear drum, left ear: Secondary | ICD-10-CM | POA: Insufficient documentation

## 2018-08-31 DIAGNOSIS — H9202 Otalgia, left ear: Secondary | ICD-10-CM | POA: Diagnosis present

## 2018-08-31 DIAGNOSIS — H66001 Acute suppurative otitis media without spontaneous rupture of ear drum, right ear: Secondary | ICD-10-CM

## 2018-08-31 DIAGNOSIS — Z79899 Other long term (current) drug therapy: Secondary | ICD-10-CM | POA: Diagnosis not present

## 2018-08-31 DIAGNOSIS — I10 Essential (primary) hypertension: Secondary | ICD-10-CM | POA: Insufficient documentation

## 2018-08-31 MED ORDER — AMOXICILLIN 500 MG PO CAPS: 500.0000 mg | ORAL_CAPSULE | Freq: Three times a day (TID) | ORAL | 0 refills | Status: DC

## 2018-08-31 NOTE — Discharge Instructions (Addendum)
You were seen in the emergency department for her severe left ear pain.  It looks like you have a left ear infection.  We are prescribing antibiotics that you should finish.  Use Tylenol and ibuprofen for pain.  Follow-up with your doctor return if any worsening symptoms.

## 2018-08-31 NOTE — ED Triage Notes (Signed)
L ear pain since 0245. Denies injury

## 2018-08-31 NOTE — ED Notes (Signed)
ED Provider at bedside. 
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
## 2018-09-01 ENCOUNTER — Other Ambulatory Visit: Payer: Self-pay | Admitting: Family Medicine

## 2018-09-05 ENCOUNTER — Encounter: Payer: Self-pay | Admitting: Internal Medicine

## 2018-09-05 ENCOUNTER — Ambulatory Visit: Payer: 59 | Admitting: Internal Medicine

## 2018-09-05 ENCOUNTER — Other Ambulatory Visit: Payer: Self-pay

## 2018-09-05 VITALS — BP 144/92 | HR 93 | Temp 97.9°F | Resp 16 | Ht 75.0 in | Wt 347.0 lb

## 2018-09-05 DIAGNOSIS — H7292 Unspecified perforation of tympanic membrane, left ear: Secondary | ICD-10-CM

## 2018-09-05 MED ORDER — OFLOXACIN 0.3 % OT SOLN: 5.0000 [drp] | Freq: Two times a day (BID) | OTIC | 0 refills | Status: DC

## 2018-09-05 NOTE — Progress Notes (Signed)
Pre visit review using our clinic review tool, if applicable. No additional management support is needed unless otherwise documented below in the visit note. 

## 2018-09-05 NOTE — Patient Instructions (Signed)
Continue the same antibiotics  Start using the eardrops  Continue ibuprofen, always with food.  We are referring you to the ENT, if you do not hear from them in the next day or so please let us know

## 2018-09-05 NOTE — Progress Notes (Signed)
Subjective:    Patient ID: Scott Pope, male    DOB: 08/19/1990, 28 y.o.   MRN: [id number]  DOS:  09/05/2018 Type of visit - description: Acute visit  Went to the ED 08/31/2018, complaining of ear pain, severe.  This happened it in the context of a recent removal of cerumen.  TMs were found to be erythematous on the left. No perforation  Since then, the pain is responding well to ibuprofen but is still there. He continue with feeling of been underwater and decreased hearing on the left ear. Has seen clear yellow discharge coming out.   BP Readings from Last 3 Encounters:  09/05/18 (!) 144/92  08/31/18 (!) 150/86  08/06/17 120/82     Review of Systems Denies fever chills No runny nose or sore throat No sinus pain or congestion Mild left facial swelling?  No rash or blisters noted anywhere in the face or neck  Past Medical History:  Diagnosis Date  . Abdominal pain, epigastric 04/04/2015  . Acute low back pain 07/07/2010  . ADHD (attention deficit hyperactivity disorder) 07/07/2010  . Allergic state 09/04/2010  . Benign essential HTN 01/14/2009   Qualifier: Diagnosis of  By: Caryl NeverBurchette MD, Bruce    . 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 Cough 04/06/2011  . Depression   . ELEVATED BLOOD PRESSURE 01/14/2009  . Fatty liver   . GERD 01/14/2009  . Hiatal hernia   . Hyperglycemia 10/11/2015  . Hyperlipidemia, mixed 10/11/2015  . IBS (irritable bowel syndrome) 08/25/2013  . Insomnia 08/04/2010  . Knee pain, left 09/24/2011  . Otitis media 11/24/2011  . Otitis media of left ear 12/09/2010  . Overweight(278.02) 07/07/2010  . Preventative health care 11/22/2012  . RUQ pain 04/04/2015  . Skin lesion of left leg 04/01/2011  . SOB (shortness of breath) on exertion 03/06/2011  . 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 Tick bite 10/17/2012  . Tinea corporis 11/22/2012  . Viral gastroenteritis 10/17/2010    Past Surgical History:  Procedure Laterality Date  . CIRCUMCISION  AT 28YRS OLD   HAD  WILD REACTION TO SEDATION MED GIVEN  . THYROGLOSSAL DUCT CYST     excised at 28 years of age  . TONSILLECTOMY AND ADENOIDECTOMY    . TYMPANOSTOMY TUBE PLACEMENT     multiple     Social History   Socioeconomic History  . Marital status: Single    Spouse name: Not on file  . Number of children: Not on file  . Years of education: Not on file  . Highest education level: Not on file  Occupational History  . Not on file  Social Needs  . Financial resource strain: Not on file  . Food insecurity    Worry: Not on file    Inability: Not on file  . Transportation needs    Medical: Not on file    Non-medical: Not on file  Tobacco Use  . Smoking status: Never Smoker  . Smokeless tobacco: Never Used  Substance and Sexual Activity  . Alcohol use: No    Alcohol/week: 0.0 standard drinks  . Drug use: No  . Sexual activity: Never  Lifestyle  . Physical activity    Days per week: Not on file    Minutes per session: Not on file  . Stress: Not on file  Relationships  . Social Musicianconnections    Talks on phone: Not on file    Gets together: Not on file    Attends religious service: Not  on file    Active member of club or organization: Not on file    Attends meetings of clubs or organizations: Not on file    Relationship status: Not on file  . Intimate partner violence    Fear of current or ex partner: Not on file    Emotionally abused: Not on file    Physically abused: Not on file    Forced sexual activity: Not on file  Other Topics Concern  . Not on file  Social History Narrative  . Not on file      Allergies as of 09/05/2018      Reactions   Strattera [atomoxetine Hcl]    Suicidal thoughts, depression      Medication List       Accurate as of September 05, 2018  1:51 PM. If you have any questions, ask your nurse or doctor.        amoxicillin 500 MG capsule Commonly known as: AMOXIL Take 1 capsule (500 mg total) by mouth 3 (three) times daily.   metoprolol succinate 25 MG  24 hr tablet Commonly known as: TOPROL-XL TAKE 1 TABLET BY MOUTH EVERY DAY           Objective:   Physical Exam HENT:     Head:     BP (!) 144/92 (BP Location: Left Arm, Patient Position: Sitting, Cuff Size: Normal)   Pulse 93   Temp 97.9 F (36.6 C) (Oral)   Resp 16   Ht 6\' 3"  (1.905 m)   Wt (!) 347 lb (157.4 kg)   SpO2 97%   BMI 43.37 kg/m  General:   Well developed, NAD, BMI noted. HEENT:  Normocephalic . Face symmetric, atraumatic --Right ear: TM slightly bulge otherwise normal --Left ear: Canal is normal, I see what seems to be abundant yellow discharge and a perforated  TM. --Face: Symmetric, minimal swelling outside the ear?  See graphic Skin: Not pale. Not jaundice Neurologic:  alert & oriented X3.  Speech normal, gait appropriate for age and unassisted Psych--  Cognition and judgment appear intact.  Cooperative with normal attention span and concentration.  Behavior appropriate. No anxious or depressed appearing.      Assessment    Perforated TM: Perforated TM suspected, continue amoxicillin prescribed by the ER, add ofloxacin otic drops and referred to ENT. Continue ibuprofen for pain control but always take it with food to prevent GI irritation. BP elevated: Likely due to pain and discomfort, reassess at every visit.

## 2019-04-19 ENCOUNTER — Encounter: Payer: Self-pay | Admitting: Internal Medicine

## 2019-04-19 ENCOUNTER — Ambulatory Visit (INDEPENDENT_AMBULATORY_CARE_PROVIDER_SITE_OTHER): Payer: 59 | Admitting: Internal Medicine

## 2019-04-19 ENCOUNTER — Other Ambulatory Visit: Payer: Self-pay

## 2019-04-19 VITALS — BP 144/99 | HR 91 | Temp 97.1°F | Resp 18 | Ht 75.0 in | Wt 338.0 lb

## 2019-04-19 DIAGNOSIS — I10 Essential (primary) hypertension: Secondary | ICD-10-CM

## 2019-04-19 MED ORDER — METOPROLOL TARTRATE 50 MG PO TABS: 50.0000 mg | ORAL_TABLET | Freq: Two times a day (BID) | ORAL | 1 refills | Status: DC

## 2019-04-19 NOTE — Patient Instructions (Addendum)
GO TO THE LAB : Get the blood work     GO TO THE FRONT DESK Come back for a checkup with your primary doctor or me in 1 weeks, please make an appointment  Start taking metoprolol twice a day  Check the  blood pressure 2 or 3 times a week BP GOAL is between 110/65 and  135/85. If it is consistently higher or lower, let me know   If the chest pain, headache or dizziness do not resolve in the next 24 to 36 hours, please call the office. If you have severe symptoms: Go to the ER

## 2019-04-19 NOTE — Progress Notes (Signed)
Subjective:    Patient ID: Scott Pope, male    DOB: 19-Feb-1990, 29 y.o.   MRN: [id number]  DOS:  04/19/2019 Type of visit - description: Acute Patient went to a physical exam for the [employer], BP was elevated in the 160-170 range , DBP over 100. That happened a couple of hours ago. Today he has developed a number of symptoms including: - Chest pain, mid anterior chest, no radiation.  No associated with cough or difficulty breathing. -Shortly after he was told his BP was elevated he also developed a headache, mild, no associated with double vision, facial numbness or motor deficits. -He also developed mild dizziness, poorly defined, "not spinning".  He admits to some anxiety lately.  BP Readings from Last 3 Encounters:  04/19/19 (!) 144/99  09/05/18 (!) 144/92  08/31/18 (!) 150/86   Wt Readings from Last 3 Encounters:  04/19/19 (!) 338 lb (153.3 kg)  09/05/18 (!) 347 lb (157.4 kg)  08/31/18 (!) 320 lb (145.2 kg)     Review of Systems See above   Past Medical History:  Diagnosis Date  . Abdominal pain, epigastric 04/04/2015  . Acute low back pain 07/07/2010  . ADHD (attention deficit hyperactivity disorder) 07/07/2010  . Allergic state 09/04/2010  . Benign essential HTN 01/14/2009   Qualifier: Diagnosis of  By: Elease Hashimoto MD, Bruce    . 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 Cough 04/06/2011  . Depression   . ELEVATED BLOOD PRESSURE 01/14/2009  . Fatty liver   . GERD 01/14/2009  . Hiatal hernia   . Hyperglycemia 10/11/2015  . Hyperlipidemia, mixed 10/11/2015  . IBS (irritable bowel syndrome) 08/25/2013  . Insomnia 08/04/2010  . Knee pain, left 09/24/2011  . Otitis media 11/24/2011  . Otitis media of left ear 12/09/2010  . Overweight(278.02) 07/07/2010  . Preventative health care 11/22/2012  . RUQ pain 04/04/2015  . Skin lesion of left leg 04/01/2011  . SOB (shortness of breath) on exertion 03/06/2011  . 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 Tick bite  10/17/2012  . Tinea corporis 11/22/2012  . Viral gastroenteritis 10/17/2010    Past Surgical History:  Procedure Laterality Date  . CIRCUMCISION  AT 29YRS OLD   HAD WILD REACTION TO SEDATION MED GIVEN  . THYROGLOSSAL DUCT CYST     excised at 29 years of age  . TONSILLECTOMY AND ADENOIDECTOMY    . TYMPANOSTOMY TUBE PLACEMENT     multiple     Allergies as of 04/19/2019      Reactions   Strattera [atomoxetine Hcl]    Suicidal thoughts, depression      Medication List       Accurate as of April 19, 2019  3:09 PM. If you have any questions, ask your nurse or doctor.        amoxicillin 500 MG capsule Commonly known as: AMOXIL Take 1 capsule (500 mg total) by mouth 3 (three) times daily.   metoprolol succinate 25 MG 24 hr tablet Commonly known as: TOPROL-XL TAKE 1 TABLET BY MOUTH EVERY DAY   ofloxacin 0.3 % OTIC solution Commonly known as: Floxin Otic Place 5 drops into the left ear 2 (two) times daily.             Objective:   Physical Exam BP (!) 144/99 (BP Location: Left Arm, Patient Position: Sitting, Cuff Size: Normal)   Pulse 91   Temp (!) 97.1 F (36.2 C) (Temporal)   Resp 18   Ht 6\' 3"  (  1.905 m)   Wt (!) 338 lb (153.3 kg)   SpO2 99%   BMI 42.25 kg/m  General:   Well developed, NAD, BMI noted.  HEENT:  Normocephalic . Face symmetric, atraumatic Neck: No thyromegaly Lungs:  CTA B Normal respiratory effort, no intercostal retractions, no accessory muscle use. Heart: RRR,  no murmur.  Abdomen:  Not distended, soft, non-tender. No rebound or rigidity.  No bruit. Skin: Not pale. Not jaundice Lower extremities: no pretibial edema bilaterally  Neurologic:  alert & oriented X3.  Speech normal, gait appropriate for age and unassisted Psych--  Cognition and judgment appear intact.  Cooperative with normal attention span and concentration.  Behavior appropriate. No anxious or depressed appearing.     Assessment    29 year old gentleman, history of  ADHD, essential HTN, morbid obesity, presents with  Hypertension   Reports HTN was diagnosed at age 28, at the time he was very fit and athletic. At some point he was taking ADHD medication, after he quit the ADHD meds, BP normalized then he stopped metoprolol. Today he has developed a number of symptoms including headache, dizziness, chest pain as described above mostly after he was told his BP was slightly elevated. EKG today NSR BP likely is essential ,not secondary,  but may need further evaluation at some point if doesn't respond to therapy. Plan: CMP, CBC, TSH. Restart metoprolol. Monitor BPs See PCP in 1 weeks b/c needs to be cleared for [facility] training w/ includes exercise  . Morbid obesity: Strongly recommend to watch diet and start a gradual exercise program. Other symptoms including chest pain, dizziness, headache: Exam is benign, recommend to reassess on RTC, if any of those get much worse he either will let us know or go to the ER  This visit occurred during the SARS-CoV-2 public health emergency.  Safety protocols were in place, including screening questions prior to the visit, additional usage of staff PPE, and extensive cleaning of exam room while observing appropriate contact time as indicated for disinfecting solutions.

## 2019-04-19 NOTE — Progress Notes (Signed)
Pre visit review using our clinic review tool, if applicable. No additional management support is needed unless otherwise documented below in the visit note. 

## 2019-04-20 LAB — COMPREHENSIVE METABOLIC PANEL
ALT: 70 U/L — ABNORMAL HIGH (ref 0–53)
AST: 37 U/L (ref 0–37)
Albumin: 4.3 g/dL (ref 3.5–5.2)
Alkaline Phosphatase: 93 U/L (ref 39–117)
BUN: 11 mg/dL (ref 6–23)
CO2: 28 mEq/L (ref 19–32)
Calcium: 10 mg/dL (ref 8.4–10.5)
Chloride: 101 mEq/L (ref 96–112)
Creatinine, Ser: 1.08 mg/dL (ref 0.40–1.50)
GFR: 82.28 mL/min (ref >60.00)
Glucose, Bld: 75 mg/dL (ref 70–99)
Potassium: 4.4 mEq/L (ref 3.5–5.1)
Sodium: 139 mEq/L (ref 135–145)
Total Bilirubin: 0.7 mg/dL (ref 0.2–1.2)
Total Protein: 7.4 g/dL (ref 6.0–8.3)

## 2019-04-20 LAB — CBC WITH DIFFERENTIAL/PLATELET
Basophils Absolute: 0.1 10*3/uL (ref 0.0–0.1)
Basophils Relative: 1 % (ref 0.0–3.0)
Eosinophils Absolute: 0.3 10*3/uL (ref 0.0–0.7)
Eosinophils Relative: 3.3 % (ref 0.0–5.0)
HCT: 46.9 % (ref 39.0–52.0)
Hemoglobin: 16.2 g/dL (ref 13.0–17.0)
Lymphocytes Relative: 28.4 % (ref 12.0–46.0)
Lymphs Abs: 2.8 10*3/uL (ref 0.7–4.0)
MCHC: 34.6 g/dL (ref 30.0–36.0)
MCV: 86.4 fl (ref 78.0–100.0)
Monocytes Absolute: 0.9 10*3/uL (ref 0.1–1.0)
Monocytes Relative: 9.4 % (ref 3.0–12.0)
Neutro Abs: 5.7 10*3/uL (ref 1.4–7.7)
Neutrophils Relative %: 57.9 % (ref 43.0–77.0)
Platelets: 292 10*3/uL (ref 150.0–400.0)
RBC: 5.43 Mil/uL (ref 4.22–5.81)
RDW: 13.5 % (ref 11.5–15.5)
WBC: 9.8 10*3/uL (ref 4.0–10.5)

## 2019-04-20 LAB — TSH: TSH: 1.2 u[IU]/mL (ref 0.35–4.50)

## 2019-04-24 ENCOUNTER — Other Ambulatory Visit: Payer: Self-pay

## 2019-04-25 ENCOUNTER — Ambulatory Visit (INDEPENDENT_AMBULATORY_CARE_PROVIDER_SITE_OTHER): Payer: 59 | Admitting: Family Medicine

## 2019-04-25 ENCOUNTER — Encounter: Payer: Self-pay | Admitting: Family Medicine

## 2019-04-25 VITALS — BP 124/86 | HR 75 | Temp 96.4°F | Ht 75.0 in | Wt 342.0 lb

## 2019-04-25 DIAGNOSIS — I10 Essential (primary) hypertension: Secondary | ICD-10-CM | POA: Diagnosis not present

## 2019-04-25 NOTE — Patient Instructions (Signed)
Take your medicine.  Keep the diet clean and stay active.  Let us know if you need anything.

## 2019-04-25 NOTE — Progress Notes (Signed)
Chief Complaint  Patient presents with  . Hypertension    Needs letter for PT to continue    Subjective Scott Pope is a 29 y.o. male who presents for hypertension follow up. He does monitor home blood pressures. Blood pressures ranging from 120's/70-80's on average. He is compliant with medication- metoprolol tartrate 50 mg bid. Patient has these side effects of medication: none He is usually adhering to a healthy diet overall. 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 Denies CP or SOB.    Past Medical History:  Diagnosis Date  . Abdominal pain, epigastric 04/04/2015  . Acute low back pain 07/07/2010  . ADHD (attention deficit hyperactivity disorder) 07/07/2010  . Allergic state 09/04/2010  . Benign essential HTN 01/14/2009   Qualifier: Diagnosis of  By: Caryl Never MD, Bruce    . 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 Cough 04/06/2011  . Depression   . ELEVATED BLOOD PRESSURE 01/14/2009  . Fatty liver   . GERD 01/14/2009  . Hiatal hernia   . Hyperglycemia 10/11/2015  . Hyperlipidemia, mixed 10/11/2015  . IBS (irritable bowel syndrome) 08/25/2013  . Insomnia 08/04/2010  . Knee pain, left 09/24/2011  . Otitis media 11/24/2011  . Otitis media of left ear 12/09/2010  . Overweight(278.02) 07/07/2010  . Preventative health care 11/22/2012  . RUQ pain 04/04/2015  . Skin lesion of left leg 04/01/2011  . SOB (shortness of breath) on exertion 03/06/2011  . 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 Tick bite 10/17/2012  . Tinea corporis 11/22/2012  . Viral gastroenteritis 10/17/2010    Review of Systems Cardiovascular: no chest pain Respiratory:  no shortness of breath  Exam BP 124/86 (BP Location: Right Arm, Patient Position: Sitting, Cuff Size: Large)   Pulse 75   Temp (!) 96.4 F (35.8 C) (Temporal)   Ht 6\' 3"  (1.905 m)   Wt (!) 342 lb (155.1 kg)   SpO2 98%   BMI 42.75 kg/m  General:  well developed, well nourished, in no apparent distress Heart: RRR, no  bruits, no LE edema Lungs: clear to auscultation, no accessory muscle use Psych: well oriented with normal range of affect and appropriate judgment/insight  Benign essential HTN  Cont metoprolol. Counseled on diet and exercise. Letter stating he may return to PT provided.  F/u in as originally scheduled. The patient voiced understanding and agreement to the plan.  [place], DO 04/25/19  2:55 PM

## 2019-04-28 ENCOUNTER — Ambulatory Visit: Payer: 59 | Admitting: Internal Medicine

## 2019-05-11 ENCOUNTER — Other Ambulatory Visit: Payer: Self-pay | Admitting: Internal Medicine

## 2019-05-26 ENCOUNTER — Other Ambulatory Visit: Payer: Self-pay | Admitting: Family Medicine

## 2019-06-14 ENCOUNTER — Encounter: Payer: Self-pay | Admitting: Family Medicine

## 2019-06-14 ENCOUNTER — Ambulatory Visit: Payer: 59 | Admitting: Family Medicine

## 2019-06-14 ENCOUNTER — Other Ambulatory Visit: Payer: Self-pay

## 2019-06-14 VITALS — BP 122/82 | HR 93 | Temp 96.3°F | Ht 76.0 in | Wt 341.0 lb

## 2019-06-14 DIAGNOSIS — F909 Attention-deficit hyperactivity disorder, unspecified type: Secondary | ICD-10-CM

## 2019-06-14 MED ORDER — LISDEXAMFETAMINE DIMESYLATE 50 MG PO CAPS: 50.0000 mg | ORAL_CAPSULE | ORAL | 0 refills | Status: DC

## 2019-06-14 NOTE — Patient Instructions (Signed)
Keep an eye on your blood pressure and pulse.   Let us know if you need anything.

## 2019-06-14 NOTE — Progress Notes (Signed)
Chief Complaint  Patient presents with  . ADHD    go back on medication    Scott Pope is 29 y.o. male here for ADHD follow up.  Patient was on Vyvanse in the past. Symptoms include inattention. Had no AE's while on it.  Failed Strattera, Adderall, Concerta, Ritalin in past.  Dx'd by therapist as a child and again as an adolescent by a psychotherapist.   Past Medical History:  Diagnosis Date  . Abdominal pain, epigastric 04/04/2015  . Acute low back pain 07/07/2010  . ADHD (attention deficit hyperactivity disorder) 07/07/2010  . Allergic state 09/04/2010  . Benign essential HTN 01/14/2009   Qualifier: Diagnosis of  By: Caryl Never MD, Bruce    . 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 Cough 04/06/2011  . Depression   . ELEVATED BLOOD PRESSURE 01/14/2009  . Fatty liver   . GERD 01/14/2009  . Hiatal hernia   . Hyperglycemia 10/11/2015  . Hyperlipidemia, mixed 10/11/2015  . IBS (irritable bowel syndrome) 08/25/2013  . Insomnia 08/04/2010  . Knee pain, left 09/24/2011  . Otitis media 11/24/2011  . Otitis media of left ear 12/09/2010  . Overweight(278.02) 07/07/2010  . Preventative health care 11/22/2012  . RUQ pain 04/04/2015  . Skin lesion of left leg 04/01/2011  . SOB (shortness of breath) on exertion 03/06/2011  . 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 Tick bite 10/17/2012  . Tinea corporis 11/22/2012  . Viral gastroenteritis 10/17/2010    BP 122/82 (BP Location: Left Arm, Patient Position: Sitting, Cuff Size: Large)   Pulse 93   Temp (!) 96.3 F (35.7 C) (Temporal)   Ht 6\' 4"  (1.93 m)   Wt (!) 341 lb (154.7 kg)   SpO2 96%   BMI 41.51 kg/m  Gen- awake, alert, appearing stated age Lungs- no accessory muscle use Neuro- no facial tics Psych- age appropriate judgment and insight, normal mood and affect  Attention deficit hyperactivity disorder (ADHD), unspecified ADHD type - Plan: lisdexamfetamine (VYVANSE) 50 MG capsule  Status: Chronic, exacerbation/worsening; Restart above,  monitor BP and pulse. Reck in 1 mo w Dr. .  Pt voiced understanding and agreement to the plan.  Abner Greenspan [place], DO 06/14/19 2:51 PM

## 2019-06-23 ENCOUNTER — Encounter: Payer: Self-pay | Admitting: Family Medicine

## 2019-06-23 ENCOUNTER — Telehealth (INDEPENDENT_AMBULATORY_CARE_PROVIDER_SITE_OTHER): Payer: 59 | Admitting: Family Medicine

## 2019-06-23 ENCOUNTER — Other Ambulatory Visit: Payer: Self-pay

## 2019-06-23 DIAGNOSIS — F909 Attention-deficit hyperactivity disorder, unspecified type: Secondary | ICD-10-CM | POA: Diagnosis not present

## 2019-06-23 NOTE — Progress Notes (Signed)
CC: Med f/u  Subjective: Patient is a 29 y.o. male here for f/u adhd. Due to COVID-19 pandemic, we are interacting via web portal for an electronic face-to-face visit. I verified patient's ID using 2 identifiers. Patient agreed to proceed with visit via this method. Patient is in parked car, I am at office. Patient and I are present for visit.   Pt started back on Vyvanse 50 mg/d in late April as he had a different job. +hx of ADHD, dx'd as an adult while he was followed at this office. The medicine is working. It is also making him more irritable. He is wondering what the next steps are.   Past Medical History:  Diagnosis Date  . Abdominal pain, epigastric 04/04/2015  . Acute low back pain 07/07/2010  . ADHD (attention deficit hyperactivity disorder) 07/07/2010  . Allergic state 09/04/2010  . Benign essential HTN 01/14/2009   Qualifier: Diagnosis of  By: Caryl Never MD, Bruce    . 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 Cough 04/06/2011  . Depression   . ELEVATED BLOOD PRESSURE 01/14/2009  . Fatty liver   . GERD 01/14/2009  . Hiatal hernia   . Hyperglycemia 10/11/2015  . Hyperlipidemia, mixed 10/11/2015  . IBS (irritable bowel syndrome) 08/25/2013  . Insomnia 08/04/2010  . Knee pain, left 09/24/2011  . Otitis media 11/24/2011  . Otitis media of left ear 12/09/2010  . Overweight(278.02) 07/07/2010  . Preventative health care 11/22/2012  . RUQ pain 04/04/2015  . Skin lesion of left leg 04/01/2011  . SOB (shortness of breath) on exertion 03/06/2011  . 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 Tick bite 10/17/2012  . Tinea corporis 11/22/2012  . Viral gastroenteritis 10/17/2010    Objective: No conversational dyspnea Age appropriate judgment and insight Nml affect and mood  Assessment and Plan: Attention deficit hyperactivity disorder (ADHD), unspecified ADHD type  Cont Vyvanse for next 7-10 d. If this does not improve or worsens, he will let us know and we will change to [place]. He will send me a  message. F/u as originally scheduled otherwise.  The patient voiced understanding and agreement to the plan.  Jilda Roche [place], DO 06/23/19  2:12 PM

## 2019-06-30 ENCOUNTER — Other Ambulatory Visit: Payer: Self-pay

## 2019-06-30 ENCOUNTER — Encounter (HOSPITAL_BASED_OUTPATIENT_CLINIC_OR_DEPARTMENT_OTHER): Payer: Self-pay | Admitting: *Deleted

## 2019-06-30 ENCOUNTER — Emergency Department (HOSPITAL_BASED_OUTPATIENT_CLINIC_OR_DEPARTMENT_OTHER)
Admission: EM | Admit: 2019-06-30 | Discharge: 2019-06-30 | Disposition: A | Discharge status: Home / Self Care | Payer: 59 | Attending: Emergency Medicine | Admitting: Emergency Medicine

## 2019-06-30 DIAGNOSIS — R202 Paresthesia of skin: Secondary | ICD-10-CM | POA: Insufficient documentation

## 2019-06-30 DIAGNOSIS — Z79899 Other long term (current) drug therapy: Secondary | ICD-10-CM | POA: Insufficient documentation

## 2019-06-30 DIAGNOSIS — Z888 Allergy status to other drugs, medicaments and biological substances status: Secondary | ICD-10-CM | POA: Diagnosis not present

## 2019-06-30 DIAGNOSIS — R110 Nausea: Secondary | ICD-10-CM | POA: Insufficient documentation

## 2019-06-30 DIAGNOSIS — E785 Hyperlipidemia, unspecified: Secondary | ICD-10-CM | POA: Diagnosis not present

## 2019-06-30 DIAGNOSIS — R55 Syncope and collapse: Secondary | ICD-10-CM | POA: Diagnosis present

## 2019-06-30 LAB — CBC WITH DIFFERENTIAL/PLATELET
Abs Immature Granulocytes: 0.08 10*3/uL — ABNORMAL HIGH (ref 0.00–0.07)
Basophils Absolute: 0.1 10*3/uL (ref 0.0–0.1)
Basophils Relative: 1 %
Eosinophils Absolute: 0.3 10*3/uL (ref 0.0–0.5)
Eosinophils Relative: 3 %
HCT: 47 % (ref 39.0–52.0)
Hemoglobin: 16.3 g/dL (ref 13.0–17.0)
Immature Granulocytes: 1 %
Lymphocytes Relative: 30 %
Lymphs Abs: 2.7 10*3/uL (ref 0.7–4.0)
MCH: 29.9 pg (ref 26.0–34.0)
MCHC: 34.7 g/dL (ref 30.0–36.0)
MCV: 86.2 fL (ref 80.0–100.0)
Monocytes Absolute: 0.7 10*3/uL (ref 0.1–1.0)
Monocytes Relative: 7 %
Neutro Abs: 5.2 10*3/uL (ref 1.7–7.7)
Neutrophils Relative %: 58 %
Platelets: 283 10*3/uL (ref 150–400)
RBC: 5.45 MIL/uL (ref 4.22–5.81)
RDW: 12.4 % (ref 11.5–15.5)
WBC: 9.1 10*3/uL (ref 4.0–10.5)
nRBC: 0 % (ref 0.0–0.2)

## 2019-06-30 LAB — URINALYSIS, MICROSCOPIC (REFLEX)
Squamous Epithelial / HPF: NONE SEEN (ref 0–5)
WBC, UA: NONE SEEN WBC/hpf (ref 0–5)

## 2019-06-30 LAB — BASIC METABOLIC PANEL
Anion gap: 8 (ref 5–15)
BUN: 11 mg/dL (ref 6–20)
CO2: 25 mmol/L (ref 22–32)
Calcium: 9.1 mg/dL (ref 8.9–10.3)
Chloride: 103 mmol/L (ref 98–111)
Creatinine, Ser: 0.99 mg/dL (ref 0.61–1.24)
GFR calc Af Amer: >60 mL/min (ref >60)
GFR calc non Af Amer: >60 mL/min (ref >60)
Glucose, Bld: 88 mg/dL (ref 70–99)
Potassium: 4.2 mmol/L (ref 3.5–5.1)
Sodium: 136 mmol/L (ref 135–145)

## 2019-06-30 LAB — URINALYSIS, ROUTINE W REFLEX MICROSCOPIC
Bilirubin Urine: NEGATIVE
Glucose, UA: NEGATIVE mg/dL
Ketones, ur: NEGATIVE mg/dL
Leukocytes,Ua: NEGATIVE
Nitrite: NEGATIVE
Protein, ur: NEGATIVE mg/dL
Specific Gravity, Urine: 1.025 (ref 1.005–1.030)
pH: 6 (ref 5.0–8.0)

## 2019-06-30 NOTE — ED Notes (Signed)
Pt drinking water for po fluid challenge.  Denies nausea, states just mild weakness remains of symptoms.

## 2019-06-30 NOTE — Discharge Instructions (Signed)
Follow up with primary care provider.  Return for new or worsening symptoms. 

## 2019-06-30 NOTE — ED Triage Notes (Signed)
Sudden onset of numbness from his waist down that lasted 5 seconds. He became pale, weak, nauseated and almost passed out. Hx of compressed vertebrae.
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
## 2019-06-30 NOTE — ED Notes (Signed)
While discussing discharge instruction, pt and family voiced additional concerns/questions as to cause of symptoms, pt works as a Corporate treasurer and seeking clarification if it is safe for him to return to work in that capacity.  Provider notified.

## 2019-06-30 NOTE — ED Notes (Signed)
Drew blue and dk green tubes also, if needed

## 2019-07-07 ENCOUNTER — Ambulatory Visit: Payer: 59 | Admitting: Medical

## 2019-07-21 ENCOUNTER — Other Ambulatory Visit: Payer: Self-pay

## 2019-07-21 ENCOUNTER — Encounter: Payer: Self-pay | Admitting: Family Medicine

## 2019-07-21 ENCOUNTER — Ambulatory Visit (INDEPENDENT_AMBULATORY_CARE_PROVIDER_SITE_OTHER): Payer: 59 | Admitting: Family Medicine

## 2019-07-21 VITALS — BP 120/78 | HR 96 | Temp 96.6°F | Ht 76.0 in | Wt 334.5 lb

## 2019-07-21 DIAGNOSIS — R350 Frequency of micturition: Secondary | ICD-10-CM

## 2019-07-21 DIAGNOSIS — R3915 Urgency of urination: Secondary | ICD-10-CM

## 2019-07-21 LAB — HEMOGLOBIN A1C: Hgb A1c MFr Bld: 5.1 % (ref 4.6–6.5)

## 2019-07-21 NOTE — Progress Notes (Signed)
Chief Complaint  Patient presents with  . Follow-up    Subjective: Patient is a 29 y.o. male here for ED f/u.  On 5/14, the patient went to the emergency department for presyncope.  He was at work and for several seconds felt lightheaded, off balance, and tingling below his waist.  He did lose control of bladder function.  Since that time, he has not had any symptoms related to lightheadedness but he does have more urinary frequency and retention.  He does feel he may be more thirsty than usual.  He has lost 10 pounds since starting Vyvanse but no other unexplained weight changes.  As last A1c several years ago was 5.2.  No changes in diet or fluid intake otherwise.  He does not drink alcohol.  Caffeine intake is unchanged over the past 4 months.  He is not having any discharge, fevers, bleeding, or pain with urination.  Bowel movements are unchanged.  Past Medical History:  Diagnosis Date  . Abdominal pain, epigastric 04/04/2015  . Acute low back pain 07/07/2010  . ADHD (attention deficit hyperactivity disorder) 07/07/2010  . Allergic state 09/04/2010  . Benign essential HTN 01/14/2009   Qualifier: Diagnosis of  By: Caryl Never MD, Bruce    . 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 Cough 04/06/2011  . Depression   . ELEVATED BLOOD PRESSURE 01/14/2009  . Fatty liver   . GERD 01/14/2009  . Hiatal hernia   . Hyperglycemia 10/11/2015  . Hyperlipidemia, mixed 10/11/2015  . IBS (irritable bowel syndrome) 08/25/2013  . Insomnia 08/04/2010  . Knee pain, left 09/24/2011  . Otitis media 11/24/2011  . Otitis media of left ear 12/09/2010  . Overweight(278.02) 07/07/2010  . Preventative health care 11/22/2012  . RUQ pain 04/04/2015  . Skin lesion of left leg 04/01/2011  . SOB (shortness of breath) on exertion 03/06/2011  . 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 Tick bite 10/17/2012  . Tinea corporis 11/22/2012  . Viral gastroenteritis 10/17/2010    Objective: BP 120/78 (BP Location: Left Arm, Patient  Position: Sitting, Cuff Size: Large)   Pulse 96   Temp (!) 96.6 F (35.9 C) (Temporal)   Ht 6\' 4"  (1.93 m)   Wt (!) 334 lb 8 oz (151.7 kg)   SpO2 97%   BMI 40.72 kg/m  General: Awake, appears stated age HEENT: MMM, EOMi Heart: RRR, no lower extremity edema, no bruits Neuro: gait is normal Lungs: CTAB, no rales, wheezes or rhonchi. No accessory muscle use Psych: Age appropriate judgment and insight, normal affect and mood  Assessment and Plan: Urinary frequency - Plan: Hemoglobin A1c, Urinalysis, Urine Culture  Urinary urgency - Plan: Hemoglobin A1c, Urinalysis, Urine Culture  Presyncope has resolved.  Check above labs.  If does not continue to improve over the next 1 to 2 weeks, he will let know and we will refer him to the urology team assuming labs are normal. The patient voiced understanding and agreement to the plan.  [place], DO 07/21/19  10:44 AM

## 2019-07-21 NOTE — Patient Instructions (Addendum)
Give Scott Pope 4-5 business days to get the results of your labs back.   Keep the diet clean and stay active.   If things aren't getting better in the next week, let me know and we can get you set up with a urologist.   Let Scott Pope know if you need anything.

## 2019-07-21 NOTE — Addendum Note (Signed)
Addended by: Mervin Kung A on: 07/21/2019 04:30 PM   Modules accepted: Orders

## 2019-07-26 ENCOUNTER — Other Ambulatory Visit: Payer: Self-pay | Admitting: Family Medicine

## 2019-07-26 DIAGNOSIS — R350 Frequency of micturition: Secondary | ICD-10-CM

## 2019-08-01 ENCOUNTER — Other Ambulatory Visit: Payer: Self-pay

## 2019-08-01 DIAGNOSIS — F909 Attention-deficit hyperactivity disorder, unspecified type: Secondary | ICD-10-CM

## 2019-08-02 NOTE — Telephone Encounter (Signed)
This cannot be refilled he has not been seen by me in 2 years. He needs at least a virtual visit

## 2019-08-03 ENCOUNTER — Telehealth: Payer: Self-pay | Admitting: Family Medicine

## 2019-08-03 DIAGNOSIS — F909 Attention-deficit hyperactivity disorder, unspecified type: Secondary | ICD-10-CM

## 2019-08-03 NOTE — Telephone Encounter (Signed)
Patient has been seeing Dr. Carmelia Roller since March.   He has not been able to get in with you for some reason.  I can get him in as a virtual on 09/01/19 and get him to come in to update UDS and contract if you will be willing to fill for one month.  Last written: 06/14/19 by Dr. Carmelia Roller Last ov: 06/23/19 by Carmelia Roller Next ov: can schedule on 7/16 or he can follow up with [facility] Contract: will collect UDS: will collect

## 2019-08-03 NOTE — Telephone Encounter (Signed)
Order placed and contract placed on desk

## 2019-08-03 NOTE — Telephone Encounter (Signed)
Caller Tatum Massman  Call Back # [phone]  Patient will come in tomorrow for UDS/contract. Please add order

## 2019-08-03 NOTE — Telephone Encounter (Signed)
Patient is scheduled to come in tomorrow to do UDS.

## 2019-08-04 ENCOUNTER — Telehealth: Payer: Self-pay

## 2019-08-04 ENCOUNTER — Other Ambulatory Visit: Payer: Self-pay | Admitting: Family Medicine

## 2019-08-04 ENCOUNTER — Other Ambulatory Visit: Payer: 59

## 2019-08-04 ENCOUNTER — Other Ambulatory Visit: Payer: Self-pay

## 2019-08-04 DIAGNOSIS — F909 Attention-deficit hyperactivity disorder, unspecified type: Secondary | ICD-10-CM

## 2019-08-04 DIAGNOSIS — Z79899 Other long term (current) drug therapy: Secondary | ICD-10-CM

## 2019-08-04 MED ORDER — LISDEXAMFETAMINE DIMESYLATE 50 MG PO CAPS: 50.0000 mg | ORAL_CAPSULE | ORAL | 0 refills | Status: DC

## 2019-08-04 NOTE — Telephone Encounter (Addendum)
Patient called in to get a refill for lisdexamfetamine (VYVANSE) 50 MG capsule [[id number]]   Please send it to [id number] - [place], [place] - [address]  [address], [place] [address]  Phone:  [phone] Fax:  [phone]  DEA #:  [id number]   Per the patient Dr. Carmelia Roller is the PCP that put him on this medication. The patient also has an up coming appointment with Dr. Carmelia Roller on Wed June 23,2021 at 2:30pm for Med Refill follow up. The patient had Lab work done today 08/04/2019 for UDS/Contract  Patient really needs medication. Sent message to both nurse's to see who can refill the prescription

## 2019-08-04 NOTE — Telephone Encounter (Signed)
Last OV-07/21/2019 Next OC-08/09/2019 Looks like has been refilled by his PCP

## 2019-08-06 LAB — DRUG MONITORING, PANEL 8 WITH CONFIRMATION, URINE
6 Acetylmorphine: NEGATIVE ng/mL (ref <10)
Alcohol Metabolites: NEGATIVE ng/mL
Amphetamine: 3857 ng/mL — ABNORMAL HIGH (ref <250)
Amphetamines: POSITIVE ng/mL — AB (ref <500)
Benzodiazepines: NEGATIVE ng/mL (ref <100)
Buprenorphine, Urine: NEGATIVE ng/mL (ref <5)
Cocaine Metabolite: NEGATIVE ng/mL (ref <150)
Creatinine: 288.2 mg/dL
MDMA: NEGATIVE ng/mL (ref <500)
Marijuana Metabolite: NEGATIVE ng/mL (ref <20)
Methamphetamine: NEGATIVE ng/mL (ref <250)
Opiates: NEGATIVE ng/mL (ref <100)
Oxidant: NEGATIVE ug/mL
Oxycodone: NEGATIVE ng/mL (ref <100)
pH: 5.5 (ref 4.5–9.0)

## 2019-08-06 LAB — DM TEMPLATE

## 2019-08-09 ENCOUNTER — Ambulatory Visit: Payer: 59 | Admitting: Family Medicine

## 2019-08-11 ENCOUNTER — Other Ambulatory Visit: Payer: Self-pay

## 2019-08-11 MED ORDER — METOPROLOL TARTRATE 50 MG PO TABS: 50.0000 mg | ORAL_TABLET | Freq: Two times a day (BID) | ORAL | 0 refills | Status: DC

## 2019-09-01 ENCOUNTER — Telehealth (INDEPENDENT_AMBULATORY_CARE_PROVIDER_SITE_OTHER): Payer: 59 | Admitting: Family Medicine

## 2019-09-01 ENCOUNTER — Other Ambulatory Visit: Payer: Self-pay

## 2019-09-01 ENCOUNTER — Telehealth: Payer: Self-pay | Admitting: *Deleted

## 2019-09-01 DIAGNOSIS — E782 Mixed hyperlipidemia: Secondary | ICD-10-CM | POA: Diagnosis not present

## 2019-09-01 DIAGNOSIS — F411 Generalized anxiety disorder: Secondary | ICD-10-CM

## 2019-09-01 DIAGNOSIS — I10 Essential (primary) hypertension: Secondary | ICD-10-CM

## 2019-09-01 DIAGNOSIS — F909 Attention-deficit hyperactivity disorder, unspecified type: Secondary | ICD-10-CM | POA: Diagnosis not present

## 2019-09-01 DIAGNOSIS — R739 Hyperglycemia, unspecified: Secondary | ICD-10-CM | POA: Diagnosis not present

## 2019-09-01 MED ORDER — LISDEXAMFETAMINE DIMESYLATE 50 MG PO CAPS
50.0000 mg | ORAL_CAPSULE | Freq: Every day | ORAL | 0 refills | Status: DC
Start: 2019-09-01 — End: 2020-09-13

## 2019-09-01 MED ORDER — LISDEXAMFETAMINE DIMESYLATE 50 MG PO CAPS: 50.0000 mg | ORAL_CAPSULE | ORAL | 0 refills | Status: DC

## 2019-09-01 NOTE — Telephone Encounter (Signed)
Left message on machine to call back  Need to schedule cpe in 6 months

## 2019-09-02 NOTE — Assessment & Plan Note (Signed)
Encouraged heart healthy diet, increase exercise, avoid trans fats, consider a krill oil cap daily 

## 2019-09-02 NOTE — Assessment & Plan Note (Signed)
Monitor and report any concerns, no changes to meds. Encouraged heart healthy diet such as the DASH diet and exercise as tolerated.  ?

## 2019-09-02 NOTE — Assessment & Plan Note (Signed)
He has a new job at the [employer] fail and while it is stressful he feels he is doing well. Denies any recent flares

## 2019-09-02 NOTE — Assessment & Plan Note (Signed)
Is tolerating Vyvanse and doing well on current dose. Refills given

## 2019-09-02 NOTE — Progress Notes (Addendum)
Virtual Visit via Video Note  I connected with Scott Pope on 09/01/19 at  9:40 AM EDT by a video enabled telemedicine application and verified that I am speaking with the correct person using two identifiers.  Location: Patient: home, patient and provider are in visit Provider: home   I discussed the limitations of evaluation and management by telemedicine and the availability of in person appointments. The patient expressed understanding and agreed to proceed. Scott Pope, CMA was able to get the patient set up on a video visit.    Subjective:    Patient ID: Scott Pope, male    DOB: 09/24/1990, 29 y.o.   MRN: [id number]  No chief complaint on file.   HPI Patient is in today for follow up on chronic medical concerns. No recent febrile illness or hospitalizations. He is doing well back on his Vyvanse although he did have increased side effects when he first started it. His anxiety flared but then improved. No other acute concerns. Denies CP/palp/SOB/HA/congestion/fevers/GI or GU c/o. Taking meds as prescribed  Past Medical History:  Diagnosis Date  . Abdominal pain, epigastric 04/04/2015  . Acute low back pain 07/07/2010  . ADHD (attention deficit hyperactivity disorder) 07/07/2010  . Allergic state 09/04/2010  . Benign essential HTN 01/14/2009   Qualifier: Diagnosis of  By: Caryl NeverBurchette MD, Bruce    . 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 Cough 04/06/2011  . Depression   . ELEVATED BLOOD PRESSURE 01/14/2009  . Fatty liver   . GERD 01/14/2009  . Hiatal hernia   . Hyperglycemia 10/11/2015  . Hyperlipidemia, mixed 10/11/2015  . IBS (irritable bowel syndrome) 08/25/2013  . Insomnia 08/04/2010  . Knee pain, left 09/24/2011  . Otitis media 11/24/2011  . Otitis media of left ear 12/09/2010  . Overweight(278.02) 07/07/2010  . Preventative health care 11/22/2012  . RUQ pain 04/04/2015  . Skin lesion of left leg 04/01/2011  . SOB (shortness of breath) on exertion 03/06/2011  .  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 Tick bite 10/17/2012  . Tinea corporis 11/22/2012  . Viral gastroenteritis 10/17/2010    Past Surgical History:  Procedure Laterality Date  . CIRCUMCISION  AT 29YRS OLD   HAD WILD REACTION TO SEDATION MED GIVEN  . THYROGLOSSAL DUCT CYST     excised at 29 years of age  . TONSILLECTOMY AND ADENOIDECTOMY    . TYMPANOSTOMY TUBE PLACEMENT     multiple     Family History  Problem Relation Age of Onset  . Obesity Mother   . Depression Mother   . Fibromyalgia Mother   . Hypertension Mother   . Chronic fatigue Mother   . Hypertension Father   . Fibromyalgia Maternal Grandmother   . Other Maternal Grandmother        reflux  . Other Maternal Grandfather        Lems disease  . Hypertension Paternal Grandfather   . Arthritis Paternal Grandfather   . Gallbladder disease Other        unknown  . Colon cancer Neg Hx   . Esophageal cancer Neg Hx   . Stomach cancer Neg Hx   . Pancreatic cancer Neg Hx   . Colon polyps Neg Hx     Social History   Socioeconomic History  . Marital status: Single    Spouse name: Not on file  . Number of children: Not on file  . Years of education: Not on file  . Highest education level: Not on file  Occupational History  . Not on file  Tobacco Use  . Smoking status: Never Smoker  . Smokeless tobacco: Never Used  Substance and Sexual Activity  . Alcohol use: No    Alcohol/week: 0.0 standard drinks  . Drug use: No  . Sexual activity: Never  Other Topics Concern  . Not on file  Social History Narrative  . Not on file   Social Determinants of Health   Financial Resource Strain:   . Difficulty of Paying Living Expenses:   Food Insecurity:   . Worried About Programme researcher, broadcasting/film/video in the Last Year:   . Barista in the Last Year:   Transportation Needs:   . Freight forwarder (Medical):   Marland Kitchen Lack of Transportation (Non-Medical):   Physical Activity:   . Days of Exercise per Week:   . Minutes of Exercise per  Session:   Stress:   . Feeling of Stress :   Social Connections:   . Frequency of Communication with Friends and Family:   . Frequency of Social Gatherings with Friends and Family:   . Attends Religious Services:   . Active Member of Clubs or Organizations:   . Attends Banker Meetings:   Marland Kitchen Marital Status:   Intimate Partner Violence:   . Fear of Current or Ex-Partner:   . Emotionally Abused:   Marland Kitchen Physically Abused:   . Sexually Abused:     Outpatient Medications Prior to Visit  Medication Sig Dispense Refill  . metoprolol tartrate (LOPRESSOR) 50 MG tablet Take 1 tablet (50 mg total) by mouth 2 (two) times daily. 180 tablet 0  . lisdexamfetamine (VYVANSE) 50 MG capsule Take 1 capsule (50 mg total) by mouth every morning. 30 capsule 0   No facility-administered medications prior to visit.    Allergies  Allergen Reactions  . Strattera [Atomoxetine Hcl]     Suicidal thoughts, depression    Review of Systems  Constitutional: Negative for fever and malaise/fatigue.  HENT: Negative for congestion.   Eyes: Negative for blurred vision.  Respiratory: Negative for shortness of breath.   Cardiovascular: Negative for chest pain, palpitations and leg swelling.  Gastrointestinal: Negative for abdominal pain, blood in stool and nausea.  Genitourinary: Negative for dysuria and frequency.  Musculoskeletal: Negative for falls.  Skin: Negative for rash.  Neurological: Negative for dizziness, loss of consciousness and headaches.  Endo/Heme/Allergies: Negative for environmental allergies.  Psychiatric/Behavioral: Negative for depression. The patient is not nervous/anxious.        Objective:    Physical Exam Vitals and nursing note reviewed.  Constitutional:      General: He is not in acute distress.    Appearance: He is well-developed.  HENT:     Head: Normocephalic and atraumatic.     Nose: Nose normal.  Eyes:     General:        Right eye: No discharge.         Left eye: No discharge.  Cardiovascular:     Rate and Rhythm: Normal rate and regular rhythm.     Heart sounds: No murmur heard.   Pulmonary:     Effort: Pulmonary effort is normal.     Breath sounds: Normal breath sounds.  Abdominal:     General: Bowel sounds are normal.     Palpations: Abdomen is soft.     Tenderness: There is no abdominal tenderness.  Musculoskeletal:     Cervical back: Normal range of motion and neck supple.  Skin:    General: Skin is warm and dry.  Neurological:     Mental Status: He is alert and oriented to person, place, and time.     BP 133/85  Wt Readings from Last 3 Encounters:  07/21/19 (!) 334 lb 8 oz (151.7 kg)  06/30/19 (!) 341 lb 0.8 oz (154.7 kg)  06/14/19 (!) 341 lb (154.7 kg)    Diabetic Foot Exam - Simple   No data filed     Lab Results  Component Value Date   WBC 9.1 06/30/2019   HGB 16.3 06/30/2019   HCT 47.0 06/30/2019   PLT 283 06/30/2019   GLUCOSE 88 06/30/2019   CHOL 148 01/22/2017   TRIG 155.0 (H) 01/22/2017   HDL 36.60 (L) 01/22/2017   LDLCALC 80 01/22/2017   ALT 70 (H) 04/19/2019   AST 37 04/19/2019   NA 136 06/30/2019   K 4.2 06/30/2019   CL 103 06/30/2019   CREATININE 0.99 06/30/2019   BUN 11 06/30/2019   CO2 25 06/30/2019   TSH 1.20 04/19/2019   HGBA1C 5.1 07/21/2019    Lab Results  Component Value Date   TSH 1.20 04/19/2019   Lab Results  Component Value Date   WBC 9.1 06/30/2019   HGB 16.3 06/30/2019   HCT 47.0 06/30/2019   MCV 86.2 06/30/2019   PLT 283 06/30/2019   Lab Results  Component Value Date   NA 136 06/30/2019   K 4.2 06/30/2019   CO2 25 06/30/2019   GLUCOSE 88 06/30/2019   BUN 11 06/30/2019   CREATININE 0.99 06/30/2019   BILITOT 0.7 04/19/2019   ALKPHOS 93 04/19/2019   AST 37 04/19/2019   ALT 70 (H) 04/19/2019   PROT 7.4 04/19/2019   ALBUMIN 4.3 04/19/2019   CALCIUM 9.1 06/30/2019   ANIONGAP 8 06/30/2019   GFR 82.28 04/19/2019   Lab Results  Component Value Date   CHOL  148 01/22/2017   Lab Results  Component Value Date   HDL 36.60 (L) 01/22/2017   Lab Results  Component Value Date   LDLCALC 80 01/22/2017   Lab Results  Component Value Date   TRIG 155.0 (H) 01/22/2017   Lab Results  Component Value Date   CHOLHDL 4 01/22/2017   Lab Results  Component Value Date   HGBA1C 5.1 07/21/2019       Assessment & Plan:   Problem List Items Addressed This Visit    Anxiety state    He has a new job at the [employer] fail and while it is stressful he feels he is doing well. Denies any recent flares      Benign essential HTN    Monitor and report any concerns, no changes to meds. Encouraged heart healthy diet such as the DASH diet and exercise as tolerated.       ADHD (attention deficit hyperactivity disorder)    Is tolerating Vyvanse and doing well on current dose. Refills given      Relevant Medications   lisdexamfetamine (VYVANSE) 50 MG capsule   Hyperlipidemia, mixed    Encouraged heart healthy diet, increase exercise, avoid trans fats, consider a krill oil cap daily      Hyperglycemia    hgba1c acceptable, minimize simple carbs. Increase exercise as tolerated.          I have changed Scott Pope's lisdexamfetamine. I am also having him start on lisdexamfetamine and lisdexamfetamine. Additionally, I am having him maintain his metoprolol tartrate.  Meds ordered this  encounter  Medications  . lisdexamfetamine (VYVANSE) 50 MG capsule    Sig: Take 1 capsule (50 mg total) by mouth every morning. September 2021 rx    Dispense:  30 capsule    Refill:  0  . lisdexamfetamine (VYVANSE) 50 MG capsule    Sig: Take 1 capsule (50 mg total) by mouth daily. July 2021 rx    Dispense:  30 capsule    Refill:  0  . lisdexamfetamine (VYVANSE) 50 MG capsule    Sig: Take 1 capsule (50 mg total) by mouth daily. August 2021, rx    Dispense:  30 capsule    Refill:  0     I discussed the assessment and treatment plan with the patient. The  patient was provided an opportunity to ask questions and all were answered. The patient agreed with the plan and demonstrated an understanding of the instructions.   The patient was advised to call back or seek an in-person evaluation if the symptoms worsen or if the condition fails to improve as anticipated.  I provided 20 minutes of non-face-to-face time during this encounter.   Danise Edge, MD

## 2019-09-02 NOTE — Assessment & Plan Note (Signed)
hgba1c acceptable, minimize simple carbs. Increase exercise as tolerated.  

## 2020-01-10 ENCOUNTER — Other Ambulatory Visit: Payer: Self-pay

## 2020-01-10 ENCOUNTER — Encounter: Payer: Self-pay | Admitting: Medical

## 2020-01-10 ENCOUNTER — Ambulatory Visit: Payer: 59 | Admitting: Medical

## 2020-01-10 VITALS — BP 130/80 | HR 87 | Resp 18 | Ht 76.0 in | Wt 318.0 lb

## 2020-01-10 DIAGNOSIS — E782 Mixed hyperlipidemia: Secondary | ICD-10-CM | POA: Diagnosis not present

## 2020-01-10 DIAGNOSIS — F909 Attention-deficit hyperactivity disorder, unspecified type: Secondary | ICD-10-CM | POA: Diagnosis not present

## 2020-01-10 DIAGNOSIS — I10 Essential (primary) hypertension: Secondary | ICD-10-CM | POA: Diagnosis not present

## 2020-01-10 NOTE — Progress Notes (Signed)
Subjective:    Patient ID: Scott Pope, male    DOB: April 24, 1990, 29 y.o.   MRN: [id number]  HPI  Pt in for ha and dizziness. Pt states in past he has htn and when bp increases he get ha and some dizziness. Yesterday bp was 165/101. He had ha yesterday. Presently he feels fine. He notes some high blood pressure since 29 yo. Family hx of htn. Pt states his normal bp is 130-140 systolic.  Pt is on metoprolol 50 mg daily. Pt did doulbe up on metoprol yesterday.  Pt is also on ADD meds  since youth. He was off meds for [facility] but then restarted this past year.   Pt has Omron bp cuff. 63 month old machine.  Pt drinks coffee with 300 mg caffeine.(brand coffee called death coffee)  Pt states he feels like bp randomly spikes.    Review of Systems  Constitutional: Negative for chills, fatigue and fever.  Respiratory: Negative for cough, chest tightness, shortness of breath and wheezing.   Cardiovascular: Negative for chest pain and palpitations.  Gastrointestinal: Negative for abdominal pain.  Musculoskeletal: Negative for back pain.  Skin: Negative for rash.  Neurological: Negative for dizziness, syncope, speech difficulty, weakness, light-headedness and numbness.       See hpi.  Hematological: Negative for adenopathy. Does not bruise/bleed easily.  Psychiatric/Behavioral: Negative for behavioral problems, decreased concentration and sleep disturbance. The patient is not nervous/anxious.     Past Medical History:  Diagnosis Date  . Abdominal pain, epigastric 04/04/2015  . Acute low back pain 07/07/2010  . ADHD (attention deficit hyperactivity disorder) 07/07/2010  . Allergic state 09/04/2010  . Benign essential HTN 01/14/2009   Qualifier: Diagnosis of  By: Caryl Never MD, Bruce    . 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 Cough 04/06/2011  . Depression   . ELEVATED BLOOD PRESSURE 01/14/2009  . Fatty liver   . GERD 01/14/2009  . Hiatal hernia   . Hyperglycemia 10/11/2015  .  Hyperlipidemia, mixed 10/11/2015  . IBS (irritable bowel syndrome) 08/25/2013  . Insomnia 08/04/2010  . Knee pain, left 09/24/2011  . Otitis media 11/24/2011  . Otitis media of left ear 12/09/2010  . Overweight(278.02) 07/07/2010  . Preventative health care 11/22/2012  . RUQ pain 04/04/2015  . Skin lesion of left leg 04/01/2011  . SOB (shortness of breath) on exertion 03/06/2011  . 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 Tick bite 10/17/2012  . Tinea corporis 11/22/2012  . Viral gastroenteritis 10/17/2010     Social History   Socioeconomic History  . Marital status: Single    Spouse name: Not on file  . Number of children: Not on file  . Years of education: Not on file  . Highest education level: Not on file  Occupational History  . Not on file  Tobacco Use  . Smoking status: Never Smoker  . Smokeless tobacco: Never Used  Substance and Sexual Activity  . Alcohol use: No    Alcohol/week: 0.0 standard drinks  . Drug use: No  . Sexual activity: Never  Other Topics Concern  . Not on file  Social History Narrative  . Not on file   Social Determinants of Health   Financial Resource Strain:   . Difficulty of Paying Living Expenses: Not on file  Food Insecurity:   . Worried About Programme researcher, broadcasting/film/video in the Last Year: Not on file  . Ran Out of Food in the Last Year: Not on file  Transportation Needs:   .  Lack of Transportation (Medical): Not on file  . Lack of Transportation (Non-Medical): Not on file  Physical Activity:   . Days of Exercise per Week: Not on file  . Minutes of Exercise per Session: Not on file  Stress:   . Feeling of Stress : Not on file  Social Connections:   . Frequency of Communication with Friends and Family: Not on file  . Frequency of Social Gatherings with Friends and Family: Not on file  . Attends Religious Services: Not on file  . Active Member of Clubs or Organizations: Not on file  . Attends Banker Meetings: Not on file  . Marital Status: Not on  file  Intimate Partner Violence:   . Fear of Current or Ex-Partner: Not on file  . Emotionally Abused: Not on file  . Physically Abused: Not on file  . Sexually Abused: Not on file    Past Surgical History:  Procedure Laterality Date  . CIRCUMCISION  AT 29YRS OLD   HAD WILD REACTION TO SEDATION MED GIVEN  . THYROGLOSSAL DUCT CYST     excised at 29 years of age  . TONSILLECTOMY AND ADENOIDECTOMY    . TYMPANOSTOMY TUBE PLACEMENT     multiple     Family History  Problem Relation Age of Onset  . Obesity Mother   . Depression Mother   . Fibromyalgia Mother   . Hypertension Mother   . Chronic fatigue Mother   . Hypertension Father   . Fibromyalgia Maternal Grandmother   . Other Maternal Grandmother        reflux  . Other Maternal Grandfather        Lems disease  . Hypertension Paternal Grandfather   . Arthritis Paternal Grandfather   . Gallbladder disease Other        unknown  . Colon cancer Neg Hx   . Esophageal cancer Neg Hx   . Stomach cancer Neg Hx   . Pancreatic cancer Neg Hx   . Colon polyps Neg Hx     Allergies  Allergen Reactions  . Strattera [Atomoxetine Hcl]     Suicidal thoughts, depression    Current Outpatient Medications on File Prior to Visit  Medication Sig Dispense Refill  . lisdexamfetamine (VYVANSE) 50 MG capsule Take 1 capsule (50 mg total) by mouth every morning. September 2021 rx 30 capsule 0  . lisdexamfetamine (VYVANSE) 50 MG capsule Take 1 capsule (50 mg total) by mouth daily. July 2021 rx 30 capsule 0  . lisdexamfetamine (VYVANSE) 50 MG capsule Take 1 capsule (50 mg total) by mouth daily. August 2021, rx 30 capsule 0  . metoprolol tartrate (LOPRESSOR) 50 MG tablet Take 1 tablet (50 mg total) by mouth 2 (two) times daily. 180 tablet 0   No current facility-administered medications on file prior to visit.    Resp 18   Ht 6\' 4"  (1.93 m)   Wt (!) 318 lb (144.2 kg)   BMI 38.71 kg/m       Objective:   Physical Exam  General Mental  Status- Alert. General Appearance- Not in acute distress.   Skin General: Color- Normal Color. Moisture- Normal Moisture.  Neck Carotid Arteries- Normal color. Moisture- Normal Moisture. No carotid bruits. No JVD.  Chest and Lung Exam Auscultation: Breath Sounds:-Normal.  Cardiovascular Auscultation:Rythm- Regular. Murmurs & Other Heart Sounds:Auscultation of the heart reveals- No Murmurs.  Abdomen Inspection:-Inspeection Normal. Palpation/Percussion:Note:No mass. Palpation and Percussion of the abdomen reveal- Non Tender, Non Distended + BS, no rebound  or guarding.    Neurologic Cranial Nerve exam:- CN III-XII intact(No nystagmus), symmetric smile. Finger to Nose:- Normal/Intact Strength:- 5/5 equal and symmetric strength both upper and lower extremities.       Assessment & Plan:  BP is well controlled today but yesterday you had 300 mg of caffeine which I do think caused the bp to spike. In general advise avoid all caffeine when on vyvanse. At most equivalent to one cup coffee but discourage.  Check bp daily. If bp over 150/90 then can add additional metoprolol 50 mg daily.(total 100 mg)  Check bp daily and give me my chart update in one week on readings.  For high cholesterol placed future fasting cmp and lipid panel.  For ADD continue vyvanse.  Follow up date in office to be determined after bp my chart update.  Esperanza Richters, PA-C

## 2020-01-10 NOTE — Patient Instructions (Signed)
BP is well controlled today but yesterday you had 300 mg of caffeine which I do think caused the bp to spike. In general advise avoid all caffeine when on vyvanse. At most equivalent to one cup coffee but discourage.  Check bp daily. If bp over 150/90 then can add additional metoprolol 50 mg daily.(total 100 mg)  Check bp daily and give me my chart update in one week on readings.  For high cholesterol placed future fasting cmp and lipid panel.  For ADD continue vyvanse.  Follow up date in office to be determined after bp my chart update.

## 2020-02-24 ENCOUNTER — Other Ambulatory Visit: Payer: Self-pay | Admitting: Family Medicine

## 2020-03-21 ENCOUNTER — Telehealth: Payer: Self-pay | Admitting: Family Medicine

## 2020-03-21 NOTE — Telephone Encounter (Signed)
It has been more than a year since he was seen he needs an appointment to get a refill on a controlled medicine

## 2020-03-21 NOTE — Telephone Encounter (Signed)
Medication: lisdexamfetamine (VYVANSE) 50 MG capsule    Has the patient contacted their pharmacy? No. (If no, request that the patient contact the pharmacy for the refill.) (If yes, when and what did the pharmacy advise?)  Preferred Pharmacy (with phone number or street name):  [facility] [facility] [place], [place] - [address] Phone:  [phone]  Fax:  [phone]       Agent: Please be advised that RX refills may take up to 3 business days. We ask that you follow-up with your pharmacy.

## 2020-03-21 NOTE — Telephone Encounter (Signed)
Requesting:Vyvanse 50 mg Contract: 08/04/19  UDS:08/04/19  Last Visit:03/12/19 Next Visit: Not scheduled yet Last Refill:01/21/2020  Please Advise

## 2020-03-22 NOTE — Telephone Encounter (Signed)
Tried calling patient but was unable to leave message (did not have a mailbox number)  Mychart message sent to patient.

## 2020-03-23 ENCOUNTER — Encounter: Payer: Self-pay | Admitting: Family Medicine

## 2020-03-25 ENCOUNTER — Other Ambulatory Visit: Payer: Self-pay | Admitting: Family Medicine

## 2020-03-25 DIAGNOSIS — F909 Attention-deficit hyperactivity disorder, unspecified type: Secondary | ICD-10-CM

## 2020-03-25 MED ORDER — LISDEXAMFETAMINE DIMESYLATE 50 MG PO CAPS: 50.0000 mg | ORAL_CAPSULE | ORAL | 0 refills | Status: DC

## 2020-03-27 NOTE — Telephone Encounter (Signed)
Spoke with pt and he said he will call back on Friday to get scheduled for appointment in a month or 2 per Dr. Abner Greenspan.

## 2020-08-02 ENCOUNTER — Ambulatory Visit: Payer: 59 | Admitting: Medical

## 2020-08-23 ENCOUNTER — Encounter: Payer: Self-pay | Admitting: Internal Medicine

## 2020-08-23 ENCOUNTER — Encounter: Payer: Self-pay | Admitting: Family Medicine

## 2020-08-23 ENCOUNTER — Other Ambulatory Visit: Payer: Self-pay

## 2020-08-23 ENCOUNTER — Ambulatory Visit: Payer: 59 | Admitting: Family Medicine

## 2020-08-23 VITALS — BP 140/102 | HR 77 | Temp 98.2°F | Wt 338.0 lb

## 2020-08-23 DIAGNOSIS — R0789 Other chest pain: Secondary | ICD-10-CM | POA: Diagnosis not present

## 2020-08-23 DIAGNOSIS — F17220 Nicotine dependence, chewing tobacco, uncomplicated: Secondary | ICD-10-CM | POA: Diagnosis not present

## 2020-08-23 DIAGNOSIS — G4700 Insomnia, unspecified: Secondary | ICD-10-CM | POA: Diagnosis not present

## 2020-08-23 DIAGNOSIS — I10 Essential (primary) hypertension: Secondary | ICD-10-CM

## 2020-08-23 DIAGNOSIS — F909 Attention-deficit hyperactivity disorder, unspecified type: Secondary | ICD-10-CM

## 2020-08-23 DIAGNOSIS — Z6841 Body Mass Index (BMI) 40.0 and over, adult: Secondary | ICD-10-CM

## 2020-08-23 MED ORDER — METOPROLOL TARTRATE 75 MG PO TABS: 1.0000 | ORAL_TABLET | Freq: Two times a day (BID) | ORAL | 1 refills | Status: DC

## 2020-08-23 MED ORDER — METOPROLOL TARTRATE 100 MG PO TABS: 100.0000 mg | ORAL_TABLET | Freq: Two times a day (BID) | ORAL | 1 refills | Status: DC

## 2020-08-23 NOTE — Progress Notes (Signed)
Subjective:    Patient ID: Scott Pope, male    DOB: Mar 02, 1990, 30 y.o.   MRN: [id number]  Chief Complaint  Patient presents with   Hypertension    Has been having headaches, nausea, and dizziness, while taking meds but still not helping. States he was bitten by a tick a few weeks ago and is taking antibiotics.     HPI Patient was seen today for ongoing concern.  Patient endorses elevated BP was 180/100s.  Endorses dizziness, nausea, CP at night.  Taking metoprolol 50 mg twice daily.  Notes history of HTN since age 80.  Denies changes in diet, medications, or increased stress.  Using 5 small cans of chewing tobacco/wk.  Cut down, was using one large tub wkly.  Patient endorses history of ADHD.  Previously on Vyvanse since childhood.  Endorses being out of medication times several months.  About how to restart if notices a major difference on medication.  Notes weight fluctuates between 300-330s.  Does not recall changes in diet.  May eat out 1-2 times per week.  States gets 40,004 steps at work.  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   Cough 04/06/2011   Depression    ELEVATED BLOOD PRESSURE 01/14/2009   Fatty liver    GERD 01/14/2009   Hiatal hernia    Hyperglycemia 10/11/2015   Hyperlipidemia, mixed 10/11/2015   IBS (irritable bowel syndrome) 08/25/2013   Insomnia 08/04/2010   Knee pain, left 09/24/2011   Otitis media 11/24/2011   Otitis media of left ear 12/09/2010   Overweight(278.02) 07/07/2010   Preventative health care 11/22/2012   RUQ pain 04/04/2015   Skin lesion of left leg 04/01/2011   SOB (shortness of breath) on exertion 03/06/2011   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   Tick bite 10/17/2012    Tinea corporis 11/22/2012   Viral gastroenteritis 10/17/2010    Allergies  Allergen Reactions   Strattera [Atomoxetine Hcl]     Suicidal thoughts, depression    ROS General: Denies fever, chills, night sweats, changes in appetite  + changes in weight HEENT: Denies ear pain, changes in vision, rhinorrhea, sore throat  +AH, dizziness CV: Denies CP, palpitations, SOB, orthopnea Pulm: Denies SOB, cough, wheezing GI: Denies abdominal pain, vomiting, diarrhea, constipation + nausea GU: Denies dysuria, hematuria, frequency Msk: Denies muscle cramps, joint pains Neuro: Denies weakness, numbness, tingling Skin: Denies rashes, bruising Psych: Denies depression, anxiety, hallucinations + concentration issue    Objective:    Blood pressure (!) 140/102, pulse 77, temperature 98.2 F (36.8 C), temperature source Oral, weight (!) 338 lb (153.3 kg), SpO2 98 %.  Gen. Pleasant, well-nourished, in no distress, normal affect   HEENT: [place]/AT, face symmetric, conjunctiva clear, no scleral icterus, PERRLA, EOMI, nares patent without drainage Neck: No JVD, no thyromegaly, no carotid bruits Lungs: no accessory muscle use, CTAB, no wheezes or rales Cardiovascular: RRR, no m/r/g, no peripheral edema Musculoskeletal: No deformities, no cyanosis or clubbing, normal tone Neuro:  A&Ox3, CN II-XII intact, normal gait Skin:  Warm, no lesions/ rash   Wt Readings from Last 3 Encounters:  08/23/20 (!) 338 lb (153.3 kg)  01/10/20 (!) 318 lb (144.2 kg)  07/21/19 (!) 334 lb 8 oz (  151.7 kg)    Lab Results  Component Value Date   WBC 9.1 06/30/2019   HGB 16.3 06/30/2019   HCT 47.0 06/30/2019   PLT 283 06/30/2019   GLUCOSE 88 06/30/2019   CHOL 148 01/22/2017   TRIG 155.0 (H) 01/22/2017   HDL 36.60 (L) 01/22/2017   LDLCALC 80 01/22/2017   ALT 70 (H) 04/19/2019   AST 37 04/19/2019   NA 136 06/30/2019   K 4.2 06/30/2019   CL 103 06/30/2019   CREATININE 0.99 06/30/2019   BUN 11 06/30/2019   CO2 25  06/30/2019   TSH 1.20 04/19/2019   HGBA1C 5.1 07/21/2019    Assessment/Plan:  Essential hypertension  -Uncontrolled and symptomatic -We will increase metoprolol tartrate from 50 mg twice daily to 75 mg twice daily. --Pharmacy called to say insurance would not cover 75 mg tablets.  New Rx for metoprolol tartrate 100 mg twice daily sent in. -Lifestyle modification strongly encouraged - Plan: EKG 12-Lead, Ambulatory referral to Sleep Studies, metoprolol tartrate (LOPRESSOR) 100 MG tablet, DISCONTINUED: Metoprolol Tartrate 75 MG TABS  Other chest pain -Likely 2/2 uncontrolled HTN -EKG with sinus rhythm.  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 - Plan: Ambulatory referral to Sleep Studies  Class 3 severe obesity with serious comorbidity and body mass index (BMI) of 40.0 to 44.9 in adult, unspecified obesity type (HCC) -Modifications encouraged - Plan: Ambulatory referral to Sleep Studies  ADHD -Patient advised to follow-up with PCP to restart Vyvanse  Update: Pharmacy called.  Insurance would not cover metoprolol tartrate 75 mg.  Rx for metoprolol tartrate 100 mg twice daily ordered.  F/u in 2-4 weeks with PCP or sooner if needed  Abbe Amsterdam, MD

## 2020-09-13 ENCOUNTER — Encounter: Payer: Self-pay | Admitting: Internal Medicine

## 2020-09-13 ENCOUNTER — Ambulatory Visit: Payer: 59 | Admitting: Internal Medicine

## 2020-09-13 ENCOUNTER — Other Ambulatory Visit: Payer: Self-pay

## 2020-09-13 VITALS — BP 148/80 | HR 85 | Temp 98.0°F | Resp 16 | Ht 76.0 in | Wt 327.4 lb

## 2020-09-13 DIAGNOSIS — F909 Attention-deficit hyperactivity disorder, unspecified type: Secondary | ICD-10-CM

## 2020-09-13 DIAGNOSIS — Z79899 Other long term (current) drug therapy: Secondary | ICD-10-CM

## 2020-09-13 DIAGNOSIS — I10 Essential (primary) hypertension: Secondary | ICD-10-CM | POA: Diagnosis not present

## 2020-09-13 MED ORDER — LISDEXAMFETAMINE DIMESYLATE 50 MG PO CAPS: 50.0000 mg | ORAL_CAPSULE | Freq: Every day | ORAL | 0 refills | Status: DC

## 2020-09-13 MED ORDER — METOPROLOL TARTRATE 100 MG PO TABS: 100.0000 mg | ORAL_TABLET | Freq: Two times a day (BID) | ORAL | 3 refills | Status: DC

## 2020-09-13 NOTE — Patient Instructions (Signed)
Check the  blood pressure once or twice a week BP GOAL is between 110/65 and  135/85. If it is consistently higher or lower, let me know    GO TO THE LAB : Get the blood work     GO TO THE FRONT DESK, PLEASE SCHEDULE YOUR APPOINTMENTS Come back for physical or checkup with your primary doctor in 3 months

## 2020-09-13 NOTE — Progress Notes (Signed)
Subjective:    Patient ID: Scott Pope, male    DOB: 1990-11-28, 28 y.o.   MRN: [id number]  DOS:  09/13/2020 Type of visit - description: Acute  Here to talk about ADD, I noted that he has hypertension as well.  States that back in March, he decided to try without Vyvanse, he noticed that his concentration went back to be very poor and that is creating a lot of stress at home and at work. He reports that sometimes he gets very anxious, +sweats and his face gets red. He is unable to "shut down" his brain at night, he continue thinking about multiple things.  BPs well controlled at home since they increase the metoprolol dose.  Review of Systems Denies chest pain or difficulty breathing No nausea, vomiting, diarrhea. No suicidal ideas or homicidal ideas.  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   Cough 04/06/2011   Depression    ELEVATED BLOOD PRESSURE 01/14/2009   Fatty liver    GERD 01/14/2009   Hiatal hernia    Hyperglycemia 10/11/2015   Hyperlipidemia, mixed 10/11/2015   IBS (irritable bowel syndrome) 08/25/2013   Insomnia 08/04/2010   Knee pain, left 09/24/2011   Otitis media 11/24/2011   Otitis media of left ear 12/09/2010   Overweight(278.02) 07/07/2010   Preventative health care 11/22/2012   RUQ pain 04/04/2015   Skin lesion of left leg 04/01/2011   SOB (shortness of breath) on exertion 03/06/2011   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 If you have any questions, ask your nurse or doctor.          lisdexamfetamine 50 MG capsule Commonly known as: Vyvanse Take 1 capsule (50 mg total) by mouth daily. July 2021 rx   lisdexamfetamine 50 MG capsule Commonly known as: Vyvanse Take 1 capsule (50 mg total) by mouth daily. August 2021, rx   lisdexamfetamine 50 MG capsule Commonly known as: VYVANSE Take 1 capsule (50 mg total) by mouth every morning. February 2022   metoprolol tartrate 100 MG tablet Commonly known as: LOPRESSOR Take 1 tablet (100 mg total) by mouth 2 (two) times daily.           Objective:   Physical Exam BP (!) 148/80 (BP Location: Left Arm, Patient Position: Sitting, Cuff Size: Normal)   Pulse 85   Temp 98 F (36.7 C) (Oral)   Resp 16   Ht 6\' 4"  (1.93 m)   Wt (!) 327 lb 6 oz (148.5 kg)  SpO2 97%   BMI 39.85 kg/m  General:   Well developed, NAD, BMI noted. HEENT:  Normocephalic . Face symmetric, atraumatic Lungs:  CTA B Normal respiratory effort, no intercostal retractions, no accessory muscle use. Heart: RRR,  no murmur.  Lower extremities: no pretibial edema bilaterally  Skin: Not pale. Not jaundice Neurologic:  alert & oriented X3.  Speech normal, gait appropriate for age and unassisted Psych--  Cognition and judgment appear intact.  Cooperative with normal attention span and concentration.  Behavior appropriate. No anxious or depressed appearing.      Assessment    30 year old male, PMH includes ADHD, HTN, elevated LFTs, here with:  ADD: Very long history of ADD symptoms, on medications for years PDMP reviewed, last received Vyvanse 03/25/2020 Last UDS 07/2020, low risk. He abruptly stopped taking  Vyvanse back in March to see if he can go without it but evidently he cannot, concentration become poor, that is making him very anxious and sometimes even depressed. When he was taking Vyvanse he was feeling well with no side effects. Plan: UDS, contract, RF Vyvanse, if he does not go back to normal and continue having depression, anxiety then further evaluation will be indicated. HTN: Metoprolol dose according to the patient was increased, since then ambulatory BPs are good, BP a couple of days ago: 120/80. Check BMP. See PCP in 3 months.   This visit occurred during the SARS-CoV-2 public health emergency.  Safety protocols were in place, including screening questions prior to the visit, additional usage of staff PPE, and extensive cleaning of exam room while observing appropriate contact time as indicated for disinfecting solutions.

## 2020-09-14 LAB — BASIC METABOLIC PANEL
BUN: 14 mg/dL (ref 7–25)
CO2: 25 mmol/L (ref 20–32)
Calcium: 9.4 mg/dL (ref 8.6–10.3)
Chloride: 105 mmol/L (ref 98–110)
Creat: 0.97 mg/dL (ref 0.60–1.24)
Glucose, Bld: 100 mg/dL — ABNORMAL HIGH (ref 65–99)
Potassium: 4.1 mmol/L (ref 3.5–5.3)
Sodium: 138 mmol/L (ref 135–146)

## 2020-09-14 LAB — DRUG MONITORING, PANEL 8 WITH CONFIRMATION, URINE
6 Acetylmorphine: NEGATIVE ng/mL (ref <10)
Alcohol Metabolites: NEGATIVE ng/mL (ref <500)
Amphetamines: NEGATIVE ng/mL (ref <500)
Benzodiazepines: NEGATIVE ng/mL (ref <100)
Buprenorphine, Urine: NEGATIVE ng/mL (ref <5)
Cocaine Metabolite: NEGATIVE ng/mL (ref <150)
Creatinine: 249.6 mg/dL (ref >=20.0)
MDMA: NEGATIVE ng/mL (ref <500)
Marijuana Metabolite: NEGATIVE ng/mL (ref <20)
Opiates: NEGATIVE ng/mL (ref <100)
Oxidant: NEGATIVE ug/mL (ref <200)
Oxycodone: NEGATIVE ng/mL (ref <100)
pH: 5.3 (ref 4.5–9.0)

## 2020-09-14 LAB — DM TEMPLATE

## 2020-10-07 NOTE — Progress Notes (Signed)
10/08/20- 30 yoM never smoker for sleep evaluation courtesy of Abbe Amsterdam, MD with concern of sleep disorder. Medical problem list includes HTN, IBS, GERD, DOE, Overweight, Insomnia Hyperlipidemia, ADHD,  Meds include Vyvanse, Epworth score- 0 Body weight today-326 lbs/ BMI 39.7 Covid vax-none He has been on Vyvanse since childhood for ADHD. Tried stopping it, but felt badly and slept poorly until he restarted it. Now feels he sleeps ok, but Smart-watch tells him he is waking frequently. Denies nocturia. 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 Occasionally stressful day will be followed by tossing, turning "fighting" in sleep. Does not punch or kick wife. She sleeps soundly and does not comment on him snoring or witnessed apneas.  He takes no sleep med and little caffeine.  Nose broken in past. Tonsils/ adenoids are out. Family hx of OSA/ CPAP.   Prior to Admission medications   Medication Sig Start Date End Date Taking? Authorizing Provider  lisdexamfetamine (VYVANSE) 50 MG capsule Take 1 capsule (50 mg total) by mouth daily. 09/13/20  Yes Paz, Nolon Rod, MD  metoprolol tartrate (LOPRESSOR) 100 MG tablet Take 1 tablet (100 mg total) by mouth 2 (two) times daily. 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   Cough 04/06/2011   Depression    ELEVATED BLOOD PRESSURE 01/14/2009   Fatty liver    GERD 01/14/2009   Hiatal hernia    Hyperglycemia 10/11/2015   Hyperlipidemia, mixed 10/11/2015   IBS (irritable bowel syndrome) 08/25/2013   Insomnia 08/04/2010   Knee pain, left 09/24/2011   Otitis media 11/24/2011   Otitis media of left ear 12/09/2010   Overweight(278.02)  07/07/2010   Preventative health care 11/22/2012   RUQ pain 04/04/2015   Skin lesion of left leg 04/01/2011   SOB (shortness of breath) on exertion 03/06/2011   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   Tick bite 10/17/2012   Tinea corporis 11/22/2012   Viral gastroenteritis 10/17/2010   Past Surgical History:  Procedure Laterality Date   CIRCUMCISION  AT 30YRS OLD   HAD WILD REACTION TO SEDATION MED GIVEN   THYROGLOSSAL DUCT CYST     excised at 30 years of age   TONSILLECTOMY AND ADENOIDECTOMY     TYMPANOSTOMY TUBE PLACEMENT     multiple    Family History  Problem Relation Age of Onset   Obesity Mother    Depression Mother    Fibromyalgia Mother    Hypertension Mother    Chronic fatigue Mother    Hypertension Father    Fibromyalgia Maternal Grandmother    Other Maternal Grandmother        reflux   Other Maternal Grandfather        Lems disease   Hypertension Paternal Grandfather    Arthritis Paternal Grandfather    Gallbladder disease Other        unknown   Colon cancer Neg Hx    Esophageal cancer Neg Hx    Stomach cancer Neg Hx    Pancreatic cancer Neg Hx    Colon polyps Neg Hx    Social History   Socioeconomic History   Marital status: Single    Spouse name:  Not on file   Number of children: Not on file   Years of education: Not on file   Highest education level: Not on file  Occupational History   Not on file  Tobacco Use   Smoking status: Never   Smokeless tobacco: Never  Substance and Sexual Activity   Alcohol use: No    Alcohol/week: 0.0 standard drinks   Drug use: No   Sexual activity: Never  Other Topics Concern   Not on file  Social History Narrative   Not on file   Social Determinants of Health   Financial Resource Strain: Not on file  Food Insecurity: Not on file  Transportation Needs: Not on file  Physical Activity: Not on file  Stress: Not on file  Social Connections: Not on file  Intimate Partner Violence: Not on file   ROS-see HPI   Negative  unless "+" Constitutional:    weight loss, night sweats, fevers, chills, fatigue, lassitude. HEENT:    headaches, difficulty swallowing, tooth/dental problems, sore throat,       sneezing, itching, ear ache, nasal congestion, post nasal drip, snoring CV:    chest pain, orthopnea, PND, swelling in lower extremities, anasarca,                                  dizziness, palpitations Resp:   shortness of breath with exertion or at rest.                productive cough,   non-productive cough, coughing up of blood.              change in color of mucus.  wheezing.   Skin:    rash or lesions. GI:  No-   heartburn, indigestion, abdominal pain, nausea, vomiting, diarrhea,                 change in bowel habits, loss of appetite GU: dysuria, change in color of urine, no urgency or frequency.   flank pain. MS:   joint pain, stiffness, decreased range of motion, back pain. Neuro-     nothing unusual Psych:  change in mood or affect.  depression or anxiety.   memory loss.  OBJ- Physical Exam General- Alert, Oriented, Affect-appropriate, Distress- none acute, +tall/ Obese Skin- rash-none, lesions- none, excoriation- none Lymphadenopathy- none Head- atraumatic            Eyes- Gross vision intact, PERRLA, conjunctivae and secretions clear            Ears- Hearing, canals-normal            Nose- Clear, no-Septal dev, mucus, polyps, erosion, perforation             Throat- Mallampati III, mucosa clear , drainage- none, tonsils- absent Neck- flexible , trachea midline, no stridor , thyroid nl, carotid no bruit Chest - symmetrical excursion , unlabored           Heart/CV- RRR , no murmur , no gallop  , no rub, nl s1 s2                           - JVD- none , edema- none, stasis changes- none, varices- none           Lung- clear to P&A, wheeze- none, cough- none , dullness-none, rub- none  Chest wall-  Abd-  Br/ Gen/ Rectal- Not done, not indicated Extrem- cyanosis- none, clubbing, none,  atrophy- none, strength- nl Neuro- grossly intact to observation

## 2020-10-08 ENCOUNTER — Other Ambulatory Visit: Payer: Self-pay

## 2020-10-08 ENCOUNTER — Ambulatory Visit: Payer: 59 | Admitting: Internal Medicine

## 2020-10-08 ENCOUNTER — Encounter: Payer: Self-pay | Admitting: Internal Medicine

## 2020-10-08 VITALS — BP 140/80 | HR 95 | Temp 97.9°F | Ht 76.0 in | Wt 326.6 lb

## 2020-10-08 DIAGNOSIS — R29818 Other symptoms and signs involving the nervous system: Secondary | ICD-10-CM

## 2020-10-08 DIAGNOSIS — Z6841 Body Mass Index (BMI) 40.0 and over, adult: Secondary | ICD-10-CM

## 2020-10-08 DIAGNOSIS — G4733 Obstructive sleep apnea (adult) (pediatric): Secondary | ICD-10-CM | POA: Diagnosis not present

## 2020-10-08 NOTE — Patient Instructions (Signed)
Order- schedule home sleep test   dx OSA  Please call us for results and recommendations about 2 weeks after your sleep test 

## 2020-10-08 NOTE — Assessment & Plan Note (Signed)
Consistent hx and exam with family history. Appropriate discussion. Plan- Sleep study

## 2020-10-08 NOTE — Assessment & Plan Note (Signed)
Describes himself as one of the smaller men in his family. Relationship of body weight to BP and sleep apnea explained.  Plan- encourage weight loss.

## 2020-10-16 ENCOUNTER — Other Ambulatory Visit: Payer: Self-pay | Admitting: Internal Medicine

## 2020-10-16 MED ORDER — LISDEXAMFETAMINE DIMESYLATE 50 MG PO CAPS: 50.0000 mg | ORAL_CAPSULE | Freq: Every day | ORAL | 0 refills | Status: DC

## 2020-10-16 NOTE — Telephone Encounter (Signed)
Requesting: Vyvanse 50mg  Contract: 09/13/2020 UDS: 09/13/2020 Last Visit: 09/13/2020 w/ Dr. 09/15/2020 Next Visit: 12/17/2020 Last Refill: 09/13/2020   Please Advise

## 2020-10-16 NOTE — Telephone Encounter (Signed)
Pt. Called in and stated he attempted to get a refill on medication but there wasn't a refill available. He has his follow up with Dr.Blyth 12/17/20 and stated he was supposed to have enough refills to last him until then. Wanted to see about refill on lisdexamfetamine (VYVANSE) 50 MG capsule

## 2020-11-26 ENCOUNTER — Telehealth: Payer: Self-pay | Admitting: Family Medicine

## 2020-11-26 ENCOUNTER — Other Ambulatory Visit: Payer: Self-pay | Admitting: Family Medicine

## 2020-11-26 MED ORDER — LISDEXAMFETAMINE DIMESYLATE 50 MG PO CAPS: 50.0000 mg | ORAL_CAPSULE | Freq: Every day | ORAL | 0 refills | Status: DC

## 2020-11-26 NOTE — Telephone Encounter (Signed)
Medication:lisdexamfetamine (VYVANSE) 50 MG capsule    Has the patient contacted their pharmacy? No. (If no, request that the patient contact the pharmacy for the refill.) (If yes, when and what did the pharmacy advise?)  Preferred Pharmacy (with phone number or street name): [facility] [address] [place] - [address]  [address] [place] [address]  Phone:  [phone]  Fax:  [phone]  Agent: Please be advised that RX refills may take up to 3 business days. We ask that you follow-up with your pharmacy.

## 2020-11-27 NOTE — Telephone Encounter (Signed)
Done

## 2020-12-16 ENCOUNTER — Other Ambulatory Visit: Payer: Self-pay

## 2020-12-16 ENCOUNTER — Ambulatory Visit: Payer: 59

## 2020-12-16 DIAGNOSIS — R0683 Snoring: Secondary | ICD-10-CM

## 2020-12-16 DIAGNOSIS — G4733 Obstructive sleep apnea (adult) (pediatric): Secondary | ICD-10-CM

## 2020-12-17 ENCOUNTER — Ambulatory Visit (INDEPENDENT_AMBULATORY_CARE_PROVIDER_SITE_OTHER): Payer: 59 | Admitting: Family Medicine

## 2020-12-17 VITALS — BP 152/98 | HR 89 | Temp 98.3°F | Ht 76.0 in | Wt 332.2 lb

## 2020-12-17 DIAGNOSIS — R739 Hyperglycemia, unspecified: Secondary | ICD-10-CM

## 2020-12-17 DIAGNOSIS — F909 Attention-deficit hyperactivity disorder, unspecified type: Secondary | ICD-10-CM | POA: Diagnosis not present

## 2020-12-17 DIAGNOSIS — I10 Essential (primary) hypertension: Secondary | ICD-10-CM

## 2020-12-17 DIAGNOSIS — Z6841 Body Mass Index (BMI) 40.0 and over, adult: Secondary | ICD-10-CM

## 2020-12-17 DIAGNOSIS — E782 Mixed hyperlipidemia: Secondary | ICD-10-CM

## 2020-12-17 DIAGNOSIS — Z79899 Other long term (current) drug therapy: Secondary | ICD-10-CM

## 2020-12-17 MED ORDER — LISDEXAMFETAMINE DIMESYLATE 50 MG PO CAPS: 50.0000 mg | ORAL_CAPSULE | Freq: Every day | ORAL | 0 refills | Status: AC

## 2020-12-17 MED ORDER — METOPROLOL TARTRATE 100 MG PO TABS: 100.0000 mg | ORAL_TABLET | Freq: Two times a day (BID) | ORAL | 1 refills | Status: AC

## 2020-12-17 NOTE — Patient Instructions (Addendum)
100-140/60-90 normal resting blood pressure   Hypertension, Adult High blood pressure (hypertension) is when the force of blood pumping through the arteries is too strong. The arteries are the blood vessels that carry blood from the heart throughout the body. Hypertension forces the heart to work harder to pump blood and may cause arteries to become narrow or stiff. Untreated or uncontrolled hypertension can cause a heart attack, heart failure, a stroke, kidney disease, and other problems. A blood pressure reading consists of a higher number over a lower number. Ideally, your blood pressure should be below 120/80. The first ("top") number is called the systolic pressure. It is a measure of the pressure in your arteries as your heart beats. The second ("bottom") number is called the diastolic pressure. It is a measure of the pressure in your arteries as the heart relaxes. What are the causes? The exact cause of this condition is not known. There are some conditions that result in or are related to high blood pressure. What increases the risk? Some risk factors for high blood pressure are under your control. The following factors may make you more likely to develop this condition: Smoking. Having type 2 diabetes mellitus, high cholesterol, or both. Not getting enough exercise or physical activity. Being overweight. Having too much fat, sugar, calories, or salt (sodium) in your diet. Drinking too much alcohol. Some risk factors for high blood pressure may be difficult or impossible to change. Some of these factors include: Having chronic kidney disease. Having a family history of high blood pressure. Age. Risk increases with age. Race. You may be at higher risk if you are African American. Gender. Men are at higher risk than women before age 57. After age 70, women are at higher risk than men. Having obstructive sleep apnea. Stress. What are the signs or symptoms? High blood pressure may not  cause symptoms. Very high blood pressure (hypertensive crisis) may cause: Headache. Anxiety. Shortness of breath. Nosebleed. Nausea and vomiting. Vision changes. Severe chest pain. Seizures. How is this diagnosed? This condition is diagnosed by measuring your blood pressure while you are seated, with your arm resting on a flat surface, your legs uncrossed, and your feet flat on the floor. The cuff of the blood pressure monitor will be placed directly against the skin of your upper arm at the level of your heart. It should be measured at least twice using the same arm. Certain conditions can cause a difference in blood pressure between your right and left arms. Certain factors can cause blood pressure readings to be lower or higher than normal for a short period of time: When your blood pressure is higher when you are in a health care provider's office than when you are at home, this is called white coat hypertension. Most people with this condition do not need medicines. When your blood pressure is higher at home than when you are in a health care provider's office, this is called masked hypertension. Most people with this condition may need medicines to control blood pressure. If you have a high blood pressure reading during one visit or you have normal blood pressure with other risk factors, you may be asked to: Return on a different day to have your blood pressure checked again. Monitor your blood pressure at home for 1 week or longer. If you are diagnosed with hypertension, you may have other blood or imaging tests to help your health care provider understand your overall risk for other conditions. How is this  treated? This condition is treated by making healthy lifestyle changes, such as eating healthy foods, exercising more, and reducing your alcohol intake. Your health care provider may prescribe medicine if lifestyle changes are not enough to get your blood pressure under control, and  if: Your systolic blood pressure is above 130. Your diastolic blood pressure is above 80. Your personal target blood pressure may vary depending on your medical conditions, your age, and other factors. Follow these instructions at home: Eating and drinking  Eat a diet that is high in fiber and potassium, and low in sodium, added sugar, and fat. An example eating plan is called the DASH (Dietary Approaches to Stop Hypertension) diet. To eat this way: Eat plenty of fresh fruits and vegetables. Try to fill one half of your plate at each meal with fruits and vegetables. Eat whole grains, such as whole-wheat pasta, brown rice, or whole-grain bread. Fill about one fourth of your plate with whole grains. Eat or drink low-fat dairy products, such as skim milk or low-fat yogurt. Avoid fatty cuts of meat, processed or cured meats, and poultry with skin. Fill about one fourth of your plate with lean proteins, such as fish, chicken without skin, beans, eggs, or tofu. Avoid pre-made and processed foods. These tend to be higher in sodium, added sugar, and fat. Reduce your daily sodium intake. Most people with hypertension should eat less than 1,500 mg of sodium a day. Do not drink alcohol if: Your health care provider tells you not to drink. You are pregnant, may be pregnant, or are planning to become pregnant. If you drink alcohol: Limit how much you use to: 0-1 drink a day for women. 0-2 drinks a day for men. Be aware of how much alcohol is in your drink. In the [place] one drink equals one 12 oz bottle of beer (355 mL), one 5 oz glass of wine (148 mL), or one 1 oz glass of hard liquor (44 mL). Lifestyle  Work with your health care provider to maintain a healthy body weight or to lose weight. Ask what an ideal weight is for you. Get at least 30 minutes of exercise most days of the week. Activities may include walking, swimming, or biking. Include exercise to strengthen your muscles (resistance  exercise), such as Pilates or lifting weights, as part of your weekly exercise routine. Try to do these types of exercises for 30 minutes at least 3 days a week. Do not use any products that contain nicotine or tobacco, such as cigarettes, e-cigarettes, and chewing tobacco. If you need help quitting, ask your health care provider. Monitor your blood pressure at home as told by your health care provider. Keep all follow-up visits as told by your health care provider. This is important. Medicines Take over-the-counter and prescription medicines only as told by your health care provider. Follow directions carefully. Blood pressure medicines must be taken as prescribed. Do not skip doses of blood pressure medicine. Doing this puts you at risk for problems and can make the medicine less effective. Ask your health care provider about side effects or reactions to medicines that you should watch for. Contact a health care provider if you: Think you are having a reaction to a medicine you are taking. Have headaches that keep coming back (recurring). Feel dizzy. Have swelling in your ankles. Have trouble with your vision. Get help right away if you: Develop a severe headache or confusion. Have unusual weakness or numbness. Feel faint. Have severe pain in your  chest or abdomen. Vomit repeatedly. Have trouble breathing. Summary Hypertension is when the force of blood pumping through your arteries is too strong. If this condition is not controlled, it may put you at risk for serious complications. Your personal target blood pressure may vary depending on your medical conditions, your age, and other factors. For most people, a normal blood pressure is less than 120/80. Hypertension is treated with lifestyle changes, medicines, or a combination of both. Lifestyle changes include losing weight, eating a healthy, low-sodium diet, exercising more, and limiting alcohol. This information is not intended to  replace advice given to you by your health care provider. Make sure you discuss any questions you have with your health care provider. Document Revised: 10/13/2017 Document Reviewed: 10/13/2017 Elsevier Patient Education  2022 [employer].

## 2020-12-18 LAB — CBC
HCT: 44.6 % (ref 39.0–52.0)
Hemoglobin: 15.1 g/dL (ref 13.0–17.0)
MCHC: 33.9 g/dL (ref 30.0–36.0)
MCV: 86.4 fl (ref 78.0–100.0)
Platelets: 285 10*3/uL (ref 150.0–400.0)
RBC: 5.17 Mil/uL (ref 4.22–5.81)
RDW: 13.1 % (ref 11.5–15.5)
WBC: 10.1 10*3/uL (ref 4.0–10.5)

## 2020-12-18 LAB — COMPREHENSIVE METABOLIC PANEL
ALT: 21 U/L (ref 0–53)
AST: 16 U/L (ref 0–37)
Albumin: 4.3 g/dL (ref 3.5–5.2)
Alkaline Phosphatase: 81 U/L (ref 39–117)
BUN: 15 mg/dL (ref 6–23)
CO2: 26 mEq/L (ref 19–32)
Calcium: 9.3 mg/dL (ref 8.4–10.5)
Chloride: 103 mEq/L (ref 96–112)
Creatinine, Ser: 1.21 mg/dL (ref 0.40–1.50)
GFR: 81.64 mL/min (ref >60.00)
Glucose, Bld: 84 mg/dL (ref 70–99)
Potassium: 4.2 mEq/L (ref 3.5–5.1)
Sodium: 139 mEq/L (ref 135–145)
Total Bilirubin: 0.5 mg/dL (ref 0.2–1.2)
Total Protein: 6.8 g/dL (ref 6.0–8.3)

## 2020-12-18 LAB — LDL CHOLESTEROL, DIRECT: Direct LDL: 89 mg/dL

## 2020-12-18 LAB — LIPID PANEL
Cholesterol: 151 mg/dL (ref 0–200)
HDL: 35.7 mg/dL — ABNORMAL LOW (ref >39.00)
NonHDL: 115.49
Total CHOL/HDL Ratio: 4
Triglycerides: 283 mg/dL — ABNORMAL HIGH (ref 0.0–149.0)
VLDL: 56.6 mg/dL — ABNORMAL HIGH (ref 0.0–40.0)

## 2020-12-18 LAB — TSH: TSH: 1.05 u[IU]/mL (ref 0.35–5.50)

## 2020-12-20 NOTE — Assessment & Plan Note (Signed)
hgba1c acceptable, minimize simple carbs. Increase exercise as tolerated. Continue current meds 

## 2020-12-20 NOTE — Assessment & Plan Note (Signed)
Encourage heart healthy diet such as MIND or DASH diet, increase exercise, avoid trans fats, simple carbohydrates and processed foods, consider a krill or fish or flaxseed oil cap daily.  °

## 2020-12-20 NOTE — Assessment & Plan Note (Signed)
Maintain DASH or MIND diet, decrease po intake and increase exercise as tolerated. Needs 7-8 hours of sleep nightly. Avoid trans fats, eat small, frequent meals every 4-5 hours with lean proteins, complex carbs and healthy fats. Minimize simple carbs, high fat foods and processed foods  

## 2020-12-20 NOTE — Progress Notes (Addendum)
Subjective:    Patient ID: Scott Pope, male    DOB: [birth year], 30 y.o.   MRN: [id number]  Chief Complaint  Patient presents with   Follow-up    HPI Patient is in today for follow up on chronic medical concerns. He denies any recent febrile illness or hospitalizations. He forgot to take his Metoprolol today. He denies any acute concerns. Denies CP/palp/SOB/HA/congestion/fevers/GI or GU c/o. 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   Cough 04/06/2011   Depression    ELEVATED BLOOD PRESSURE 01/14/2009   Fatty liver    GERD 01/14/2009   Hiatal hernia    Hyperglycemia 10/11/2015   Hyperlipidemia, mixed 10/11/2015   IBS (irritable bowel syndrome) 08/25/2013   Insomnia 08/04/2010   Knee pain, left 09/24/2011   Otitis media 11/24/2011   Otitis media of left ear 12/09/2010   Overweight(278.02) 07/07/2010   Preventative health care 11/22/2012   RUQ pain 04/04/2015   Skin lesion of left leg 04/01/2011   SOB (shortness of breath) on exertion 03/06/2011   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   Tick bite 10/17/2012   Tinea corporis 11/22/2012   Viral gastroenteritis 10/17/2010    Past Surgical History:  Procedure Laterality Date   CIRCUMCISION  AT 30YRS OLD   HAD WILD REACTION TO SEDATION MED GIVEN   THYROGLOSSAL DUCT CYST     excised at 30 years of age   TONSILLECTOMY AND ADENOIDECTOMY     TYMPANOSTOMY TUBE PLACEMENT     multiple     Family History  Problem Relation Age of Onset   Obesity Mother    Depression Mother    Fibromyalgia Mother    Hypertension Mother    Chronic fatigue Mother    Hypertension Father    Fibromyalgia Maternal Grandmother    Other Maternal Grandmother         reflux   Other Maternal Grandfather        Lems disease   Hypertension Paternal Grandfather    Arthritis Paternal Grandfather    Gallbladder disease Other        unknown   Colon cancer Neg Hx    Esophageal cancer Neg Hx    Stomach cancer Neg Hx    Pancreatic cancer Neg Hx    Colon polyps Neg Hx     Social History   Socioeconomic History   Marital status: Single    Spouse name: Not on file   Number of children: Not on file   Years of education: Not on file   Highest education level: Not on file  Occupational History   Not on file  Tobacco Use   Smoking status: Never   Smokeless tobacco: Never  Substance and Sexual Activity   Alcohol use: No    Alcohol/week: 0.0 standard drinks   Drug use: No   Sexual activity: Never  Other Topics Concern   Not on file  Social History Narrative   Not on file   Social Determinants of Health   Financial Resource Strain: Not on file  Food Insecurity: Not on file  Transportation Needs: Not on file  Physical Activity: Not on file  Stress: Not on file  Social  Connections: Not on file  Intimate Partner Violence: Not on file    Outpatient Medications Prior to Visit  Medication Sig Dispense Refill   lisdexamfetamine (VYVANSE) 50 MG capsule Take 1 capsule (50 mg total) by mouth daily. 30 capsule 0   metoprolol tartrate (LOPRESSOR) 100 MG tablet Take 1 tablet (100 mg total) by mouth 2 (two) times daily. 60 tablet 3   No facility-administered medications prior to visit.    Allergies  Allergen Reactions   Strattera [Atomoxetine Hcl]     Suicidal thoughts, depression    Review of Systems  Constitutional:  Negative for fever and malaise/fatigue.  HENT:  Negative for congestion.   Eyes:  Negative for blurred vision.  Respiratory:  Negative for shortness of breath.   Cardiovascular:  Negative for chest pain, palpitations and leg swelling.  Gastrointestinal:  Negative for abdominal pain, blood in stool and nausea.   Genitourinary:  Negative for dysuria and frequency.  Musculoskeletal:  Negative for falls.  Skin:  Negative for rash.  Neurological:  Negative for dizziness, loss of consciousness and headaches.  Endo/Heme/Allergies:  Negative for environmental allergies.  Psychiatric/Behavioral:  Negative for depression. The patient is not nervous/anxious.       Objective:    Physical Exam Constitutional:      General: He is not in acute distress.    Appearance: Normal appearance. He is not ill-appearing or toxic-appearing.  HENT:     Head: Normocephalic and atraumatic.     Right Ear: External ear normal.     Left Ear: External ear normal.     Nose: Nose normal.  Eyes:     General:        Right eye: No discharge.        Left eye: No discharge.  Cardiovascular:     Rate and Rhythm: Normal rate.     Heart sounds: Normal heart sounds. No murmur heard. Pulmonary:     Effort: Pulmonary effort is normal.     Breath sounds: No wheezing.  Abdominal:     General: Bowel sounds are normal.  Musculoskeletal:     Cervical back: Neck supple.  Skin:    Findings: No rash.  Neurological:     Mental Status: He is alert and oriented to person, place, and time.  Psychiatric:        Behavior: Behavior normal.    BP (!) 152/98 (BP Location: Right Arm, Cuff Size: Large) Comment: has not taken metoprolol yet  Pulse 89   Temp 98.3 F (36.8 C) (Oral)   Ht 6\' 4"  (1.93 m)   Wt (!) 332 lb 3.2 oz (150.7 kg)   SpO2 97%   BMI 40.44 kg/m  Wt Readings from Last 3 Encounters:  12/17/20 (!) 332 lb 3.2 oz (150.7 kg)  10/08/20 (!) 326 lb 9.6 oz (148.1 kg)  09/13/20 (!) 327 lb 6 oz (148.5 kg)    Diabetic Foot Exam - Simple   No data filed    Lab Results  Component Value Date   WBC 10.1 12/17/2020   HGB 15.1 12/17/2020   HCT 44.6 12/17/2020   PLT 285.0 12/17/2020   GLUCOSE 84 12/17/2020   CHOL 151 12/17/2020   TRIG 283.0 (H) 12/17/2020   HDL 35.70 (L) 12/17/2020   LDLDIRECT 89.0 12/17/2020   LDLCALC  80 01/22/2017   ALT 21 12/17/2020   AST 16 12/17/2020   NA 139 12/17/2020   K 4.2 12/17/2020   CL 103 12/17/2020   CREATININE 1.21 12/17/2020  BUN 15 12/17/2020   CO2 26 12/17/2020   TSH 1.05 12/17/2020   HGBA1C 5.1 07/21/2019    Lab Results  Component Value Date   TSH 1.05 12/17/2020   Lab Results  Component Value Date   WBC 10.1 12/17/2020   HGB 15.1 12/17/2020   HCT 44.6 12/17/2020   MCV 86.4 12/17/2020   PLT 285.0 12/17/2020   Lab Results  Component Value Date   NA 139 12/17/2020   K 4.2 12/17/2020   CO2 26 12/17/2020   GLUCOSE 84 12/17/2020   BUN 15 12/17/2020   CREATININE 1.21 12/17/2020   BILITOT 0.5 12/17/2020   ALKPHOS 81 12/17/2020   AST 16 12/17/2020   ALT 21 12/17/2020   PROT 6.8 12/17/2020   ALBUMIN 4.3 12/17/2020   CALCIUM 9.3 12/17/2020   ANIONGAP 8 06/30/2019   GFR 81.64 12/17/2020   Lab Results  Component Value Date   CHOL 151 12/17/2020   Lab Results  Component Value Date   HDL 35.70 (L) 12/17/2020   Lab Results  Component Value Date   LDLCALC 80 01/22/2017   Lab Results  Component Value Date   TRIG 283.0 (H) 12/17/2020   Lab Results  Component Value Date   CHOLHDL 4 12/17/2020   Lab Results  Component Value Date   HGBA1C 5.1 07/21/2019       Assessment & Plan:   Problem List Items Addressed This Visit     Benign essential HTN    Elevated but he did not take his Metoprolol, no changes to meds but he is advised to work hard to take it routinely. Encouraged heart healthy diet such as the DASH diet and exercise as tolerated.       Relevant Medications   metoprolol tartrate (LOPRESSOR) 100 MG tablet   Obesity    Maintain DASH or MIND diet, decrease po intake and increase exercise as tolerated. Needs 7-8 hours of sleep nightly. Avoid trans fats, eat small, frequent meals every 4-5 hours with lean proteins, complex carbs and healthy fats. Minimize simple carbs, high fat foods and processed foods      Relevant Medications    lisdexamfetamine (VYVANSE) 50 MG capsule   lisdexamfetamine (VYVANSE) 50 MG capsule   lisdexamfetamine (VYVANSE) 50 MG capsule   ADHD (attention deficit hyperactivity disorder) - Primary    He has started back on vyvanse and feels much better. He was noting depression and difficulty getting things done and now those problems have lifted.       Hyperlipidemia, mixed    Encourage heart healthy diet such as MIND or DASH diet, increase exercise, avoid trans fats, simple carbohydrates and processed foods, consider a krill or fish or flaxseed oil cap daily.       Relevant Medications   metoprolol tartrate (LOPRESSOR) 100 MG tablet   Other Relevant Orders   LDL cholesterol, direct (Completed)   Hyperglycemia    hgba1c acceptable, minimize simple carbs. Increase exercise as tolerated. Continue current meds      Other Visit Diagnoses     High risk medication use       Essential hypertension       Relevant Medications   metoprolol tartrate (LOPRESSOR) 100 MG tablet   Other Relevant Orders   CBC (Completed)   Comprehensive metabolic panel (Completed)   Lipid panel (Completed)   TSH (Completed)       I have changed [employer] T. Hoehn's lisdexamfetamine. I am also having him start on lisdexamfetamine and lisdexamfetamine. Additionally, I  am having him maintain his metoprolol tartrate.  Meds ordered this encounter  Medications   metoprolol tartrate (LOPRESSOR) 100 MG tablet    Sig: Take 1 tablet (100 mg total) by mouth 2 (two) times daily.    Dispense:  180 tablet    Refill:  1   lisdexamfetamine (VYVANSE) 50 MG capsule    Sig: Take 1 capsule (50 mg total) by mouth daily. November 2022    Dispense:  30 capsule    Refill:  0   lisdexamfetamine (VYVANSE) 50 MG capsule    Sig: Take 1 capsule (50 mg total) by mouth daily. December 2022    Dispense:  30 capsule    Refill:  0   lisdexamfetamine (VYVANSE) 50 MG capsule    Sig: Take 1 capsule (50 mg total) by mouth daily. January 2023     Dispense:  30 capsule    Refill:  0     Danise Edge, MD

## 2020-12-20 NOTE — Assessment & Plan Note (Signed)
He has started back on vyvanse and feels much better. He was noting depression and difficulty getting things done and now those problems have lifted.

## 2020-12-20 NOTE — Assessment & Plan Note (Signed)
Elevated but he did not take his Metoprolol, no changes to meds but he is advised to work hard to take it routinely. Encouraged heart healthy diet such as the DASH diet and exercise as tolerated.

## 2020-12-25 DIAGNOSIS — R0683 Snoring: Secondary | ICD-10-CM

## 2021-09-30 ENCOUNTER — Other Ambulatory Visit: Payer: Self-pay | Admitting: Nurse Practitioner

## 2021-09-30 ENCOUNTER — Ambulatory Visit
Admission: RE | Admit: 2021-09-30 | Discharge: 2021-09-30 | Disposition: A | Discharge status: Home / Self Care | Payer: No Typology Code available for payment source | Source: Ambulatory Visit | Attending: Nurse Practitioner | Admitting: Nurse Practitioner

## 2021-09-30 DIAGNOSIS — W108XXA Fall (on) (from) other stairs and steps, initial encounter: Secondary | ICD-10-CM

## 2021-09-30 DIAGNOSIS — M25572 Pain in left ankle and joints of left foot: Secondary | ICD-10-CM

## 2022-02-10 ENCOUNTER — Emergency Department (HOSPITAL_BASED_OUTPATIENT_CLINIC_OR_DEPARTMENT_OTHER): Payer: 59

## 2022-02-10 ENCOUNTER — Emergency Department (HOSPITAL_BASED_OUTPATIENT_CLINIC_OR_DEPARTMENT_OTHER)
Admission: EM | Admit: 2022-02-10 | Discharge: 2022-02-10 | Disposition: A | Discharge status: Home / Self Care | Payer: 59 | Attending: Emergency Medicine | Admitting: Emergency Medicine

## 2022-02-10 ENCOUNTER — Other Ambulatory Visit: Payer: Self-pay

## 2022-02-10 ENCOUNTER — Encounter (HOSPITAL_BASED_OUTPATIENT_CLINIC_OR_DEPARTMENT_OTHER): Payer: Self-pay | Admitting: Emergency Medicine

## 2022-02-10 DIAGNOSIS — R1110 Vomiting, unspecified: Secondary | ICD-10-CM | POA: Diagnosis not present

## 2022-02-10 DIAGNOSIS — R109 Unspecified abdominal pain: Secondary | ICD-10-CM | POA: Diagnosis present

## 2022-02-10 DIAGNOSIS — Z79899 Other long term (current) drug therapy: Secondary | ICD-10-CM | POA: Diagnosis not present

## 2022-02-10 LAB — URINALYSIS, ROUTINE W REFLEX MICROSCOPIC
Bilirubin Urine: NEGATIVE
Glucose, UA: NEGATIVE mg/dL
Ketones, ur: NEGATIVE mg/dL
Leukocytes,Ua: NEGATIVE
Nitrite: NEGATIVE
Protein, ur: NEGATIVE mg/dL
Specific Gravity, Urine: >=1.03 (ref 1.005–1.030)
pH: 5.5 (ref 5.0–8.0)

## 2022-02-10 LAB — COMPREHENSIVE METABOLIC PANEL
ALT: 36 U/L (ref 0–44)
AST: 29 U/L (ref 15–41)
Albumin: 4.1 g/dL (ref 3.5–5.0)
Alkaline Phosphatase: 78 U/L (ref 38–126)
Anion gap: 7 (ref 5–15)
BUN: 13 mg/dL (ref 6–20)
CO2: 27 mmol/L (ref 22–32)
Calcium: 9 mg/dL (ref 8.9–10.3)
Chloride: 105 mmol/L (ref 98–111)
Creatinine, Ser: 1.01 mg/dL (ref 0.61–1.24)
GFR, Estimated: >60 mL/min (ref >60)
Glucose, Bld: 112 mg/dL — ABNORMAL HIGH (ref 70–99)
Potassium: 4 mmol/L (ref 3.5–5.1)
Sodium: 139 mmol/L (ref 135–145)
Total Bilirubin: 0.5 mg/dL (ref 0.3–1.2)
Total Protein: 7.9 g/dL (ref 6.5–8.1)

## 2022-02-10 LAB — URINALYSIS, MICROSCOPIC (REFLEX): WBC, UA: NONE SEEN WBC/hpf (ref 0–5)

## 2022-02-10 LAB — CBC
HCT: 48.4 % (ref 39.0–52.0)
Hemoglobin: 16.4 g/dL (ref 13.0–17.0)
MCH: 28.9 pg (ref 26.0–34.0)
MCHC: 33.9 g/dL (ref 30.0–36.0)
MCV: 85.4 fL (ref 80.0–100.0)
Platelets: 302 10*3/uL (ref 150–400)
RBC: 5.67 MIL/uL (ref 4.22–5.81)
RDW: 12.8 % (ref 11.5–15.5)
WBC: 11.9 10*3/uL — ABNORMAL HIGH (ref 4.0–10.5)
nRBC: 0 % (ref 0.0–0.2)

## 2022-02-10 LAB — LIPASE, BLOOD: Lipase: 31 U/L (ref 11–51)

## 2022-02-10 MED ORDER — DICYCLOMINE HCL 20 MG PO TABS: 20.0000 mg | ORAL_TABLET | Freq: Three times a day (TID) | ORAL | 0 refills | Status: AC

## 2022-02-10 MED ORDER — ONDANSETRON 4 MG PO TBDP
4.0000 mg | ORAL_TABLET | Freq: Once | ORAL | Status: AC
  Administered 2022-02-10: 4 mg via ORAL
  Filled 2022-02-10: qty 1

## 2022-02-10 MED ORDER — LACTATED RINGERS IV BOLUS
1000.0000 mL | Freq: Once | INTRAVENOUS | Status: AC
  Administered 2022-02-10: 1000 mL via INTRAVENOUS

## 2022-02-10 MED ORDER — IOHEXOL 300 MG/ML  SOLN
100.0000 mL | Freq: Once | INTRAMUSCULAR | Status: AC | PRN
  Administered 2022-02-10: 100 mL via INTRAVENOUS

## 2022-02-10 NOTE — Discharge Instructions (Addendum)
If you develop worsening, continued, or recurrent abdominal pain, uncontrolled vomiting, fever, chest or back pain, or any other new/concerning symptoms then return to the ER for evaluation.  

## 2022-02-10 NOTE — ED Triage Notes (Signed)
Pt here from home with c/o abd pain hx of constipation and usually is fixed with mag citrate , took it today but did not help his stomach

## 2022-02-10 NOTE — ED Notes (Signed)
Patient transported to CT 

## 2022-02-10 NOTE — ED Provider Notes (Signed)
[facility] EMERGENCY DEPARTMENT Provider Note   CSN: [id number] Arrival date & time: 02/10/22  1630     History  Chief Complaint  Patient presents with   Abdominal Pain    Scott Pope is a 31 y.o. male.  HPI 31 year old male with a history of IBS and on and off constipation presents with abdominal pain.  Has been dealing with abdominal pain for about a week.  Thought that the bloating was from constipation and took some magnesium citrate.  Had bowel movements after this but the pain never went away.  Pain has come back and despite having a lot of bowel movements yesterday with magnesium citrate the pain is getting worse.  It is primarily right-sided.  Today he was in the bathroom and vomited and then shortly thereafter passed out.  He never had any chest pain or shortness of breath.  The pain right now is not severe but he cannot give me a number.  This feels different than his typical IBS.  Home Medications Prior to Admission medications   Medication Sig Start Date End Date Taking? Authorizing Provider  dicyclomine (BENTYL) 20 MG tablet Take 1 tablet (20 mg total) by mouth 3 (three) times daily before meals. 02/10/22  Yes Pricilla Loveless, MD  lisdexamfetamine (VYVANSE) 50 MG capsule Take 1 capsule (50 mg total) by mouth daily. November 2022 12/17/20   Bradd Canary, MD  lisdexamfetamine (VYVANSE) 50 MG capsule Take 1 capsule (50 mg total) by mouth daily. December 2022 12/17/20   Bradd Canary, MD  lisdexamfetamine (VYVANSE) 50 MG capsule Take 1 capsule (50 mg total) by mouth daily. January 2023 12/17/20   Bradd Canary, MD  metoprolol tartrate (LOPRESSOR) 100 MG tablet Take 1 tablet (100 mg total) by mouth 2 (two) times daily. 12/17/20   Bradd Canary, MD      Allergies    Strattera [atomoxetine hcl]    Review of Systems   Review of Systems  Respiratory:  Negative for shortness of breath.   Cardiovascular:  Negative for chest pain.  Gastrointestinal:  Positive  for abdominal pain, constipation and vomiting.  Neurological:  Positive for syncope.    Physical Exam Updated Vital Signs BP 137/65   Pulse (!) 52   Temp 98.2 F (36.8 C) (Oral)   Resp 18   SpO2 100%  Physical Exam Vitals and nursing note reviewed.  Constitutional:      General: He is not in acute distress.    Appearance: He is well-developed. He is obese. He is not ill-appearing or diaphoretic.  HENT:     Head: Normocephalic and atraumatic.  Cardiovascular:     Rate and Rhythm: Normal rate and regular rhythm.     Heart sounds: Normal heart sounds.  Pulmonary:     Effort: Pulmonary effort is normal.     Breath sounds: Normal breath sounds.  Abdominal:     General: There is no distension.     Palpations: Abdomen is soft.     Tenderness: There is abdominal tenderness (mild) in the periumbilical area.  Skin:    General: Skin is warm and dry.  Neurological:     Mental Status: He is alert.     ED Results / Procedures / Treatments   Labs (all labs ordered are listed, but only abnormal results are displayed) Labs Reviewed  COMPREHENSIVE METABOLIC PANEL - Abnormal; Notable for the following components:      Result Value   Glucose, Bld 112 (*)  All other components within normal limits  CBC - Abnormal; Notable for the following components:   WBC 11.9 (*)    All other components within normal limits  URINALYSIS, ROUTINE W REFLEX MICROSCOPIC - Abnormal; Notable for the following components:   Hgb urine dipstick TRACE (*)    All other components within normal limits  URINALYSIS, MICROSCOPIC (REFLEX) - Abnormal; Notable for the following components:   Bacteria, UA RARE (*)    All other components within normal limits  LIPASE, BLOOD    EKG EKG Interpretation  Date/Time:  Tuesday February 10 2022 20:34:54 EST Ventricular Rate:  55 PR Interval:  157 QRS Duration: 84 QT Interval:  426 QTC Calculation: 408 R Axis:   49 Text Interpretation: Sinus rhythm ST elevation  suggests acute pericarditis no reciprocal changes, unlikely to be ischemia Confirmed by Pricilla Loveless [phone]) on 02/10/2022 9:06:33 PM  Radiology CT ABDOMEN PELVIS W CONTRAST  Result Date: 02/10/2022 CLINICAL DATA:  Abdominal pain and constipation, suspected bowel obstruction. EXAM: CT ABDOMEN AND PELVIS WITH CONTRAST TECHNIQUE: Multidetector CT imaging of the abdomen and pelvis was performed using the standard protocol following bolus administration of intravenous contrast. RADIATION DOSE REDUCTION: This exam was performed according to the departmental dose-optimization program which includes automated exposure control, adjustment of the mA and/or kV according to patient size and/or use of iterative reconstruction technique. CONTRAST:  OMNIPAQUE IOHEXOL 300 MG/ML  SOLN COMPARISON:  CT with IV contrast 05/22/2015. FINDINGS: Lower chest: Cardiac size is normal. Lung bases are clear with mild chronic elevation of the right diaphragm. There is a tiny hiatal hernia. Hepatobiliary: Enlarged liver measuring 24 cm in length with mild-to-moderate generalized steatosis. There is no mass enhancement. The gallbladder and bile ducts are unremarkable. Pancreas: No significant focal abnormality, ductal dilatation or inflammatory change. Partial fatty replacement again noted of the uncinate process. Spleen: Slightly enlarged, 14 cm transverse. No mass or interval change. Small splenule inferiorly. Adrenals/Urinary Tract: No adrenal or renal cortical mass enhancement. There has no urinary stone or obstruction. Unremarkable bladder for the degree of distention. Stomach/Bowel: There are mild thickened folds in the stomach and jejunum. No small bowel obstruction or inflammation is seen. There is a normal caliber appendix which is well visible. There is mild fecal stasis without evidence of colitis or diverticulitis. Vascular/Lymphatic: No significant vascular findings are present. No enlarged abdominal or pelvic lymph  nodes. Reproductive: Prostate is unremarkable. Other: There is no incarcerated hernia. There is no free air, free hemorrhage or free fluid. Musculoskeletal: Limbus vertebrae anterior superior L5 body. No acute or other significant osseous findings. Bone islands again noted in the posterior column of the right acetabulum IMPRESSION: 1. No evidence of bowel obstruction or inflammation. 2. Mild constipation and scattered diverticulosis. 3. Mildly thickened folds in the stomach and jejunum which could be due to gastroenteritis or nondistention. 4. Enlarged fatty liver with mild splenomegaly. No portal vein dilatation. 5. Tiny hiatal hernia. Electronically Signed   By: Almira Bar M.D.   On: 02/10/2022 20:49    Procedures Procedures    Medications Ordered in ED Medications  ondansetron (ZOFRAN-ODT) disintegrating tablet 4 mg (4 mg Oral Given 02/10/22 1723)  lactated ringers bolus 1,000 mL (0 mLs Intravenous Stopped 02/10/22 2217)  iohexol (OMNIPAQUE) 300 MG/ML solution 100 mL (100 mLs Intravenous Contrast Given 02/10/22 2020)    ED Course/ Medical Decision Making/ A&P  Medical Decision Making Amount and/or Complexity of Data Reviewed Labs: ordered.    Details: Slight leukocytosis but otherwise labs unremarkable. Radiology: ordered and independent interpretation performed.    Details: No appendicitis  Risk Prescription drug management.   Patient has right-sided abdominal pain.  After discussion with patient seems he is primarily concerned with appendicitis.  CT is overall unremarkable.  I did note that on my read, he does seem to have more stool on the right side of his colon though this this could be transient.  Either way, he declines pain medicine and his workup is reassuring.  He does not have anyone that he follows up with so I will refer him to gastroenterology and try some Bentyl but otherwise we discussed bowel management and he appears stable for discharge  home with return precautions.        Final Clinical Impression(s) / ED Diagnoses Final diagnoses:  Right sided abdominal pain    Rx / DC Orders ED Discharge Orders          Ordered    dicyclomine (BENTYL) 20 MG tablet  3 times daily before meals        02/10/22 2140              Pricilla Loveless, MD 02/10/22 2311
